# Patient Record
Sex: Female | Born: 1955 | Race: White | Hispanic: No | Marital: Married | Smoking: Never smoker
Health system: Southern US, Community
[De-identification: ages and names within clinical notes are randomized; demographics above are authoritative.]

## PROBLEM LIST (undated history)

## (undated) DIAGNOSIS — M1990 Unspecified osteoarthritis, unspecified site: Secondary | ICD-10-CM

## (undated) DIAGNOSIS — E039 Hypothyroidism, unspecified: Secondary | ICD-10-CM

## (undated) DIAGNOSIS — IMO0001 Reserved for inherently not codable concepts without codable children: Secondary | ICD-10-CM

## (undated) DIAGNOSIS — M359 Systemic involvement of connective tissue, unspecified: Secondary | ICD-10-CM

## (undated) DIAGNOSIS — R1310 Dysphagia, unspecified: Secondary | ICD-10-CM

## (undated) DIAGNOSIS — I10 Essential (primary) hypertension: Secondary | ICD-10-CM

## (undated) DIAGNOSIS — F419 Anxiety disorder, unspecified: Secondary | ICD-10-CM

## (undated) DIAGNOSIS — T7840XA Allergy, unspecified, initial encounter: Secondary | ICD-10-CM

## (undated) HISTORY — DX: Unspecified osteoarthritis, unspecified site: M19.90

## (undated) HISTORY — PX: DILATION AND CURETTAGE OF UTERUS: SHX78

## (undated) HISTORY — DX: Reserved for inherently not codable concepts without codable children: IMO0001

## (undated) HISTORY — DX: Hypothyroidism, unspecified: E03.9

## (undated) HISTORY — PX: CARPAL TUNNEL RELEASE: SHX101

## (undated) HISTORY — DX: Essential (primary) hypertension: I10

## (undated) HISTORY — PX: FOOT SURGERY: SHX648

## (undated) HISTORY — DX: Dysphagia, unspecified: R13.10

## (undated) HISTORY — DX: Systemic involvement of connective tissue, unspecified: M35.9

## (undated) HISTORY — DX: Allergy, unspecified, initial encounter: T78.40XA

## (undated) HISTORY — DX: Anxiety disorder, unspecified: F41.9

## (undated) HISTORY — PX: OTHER SURGICAL HISTORY: SHX169

---

## 1998-01-25 ENCOUNTER — Other Ambulatory Visit: Admission: RE | Admit: 1998-01-25 | Discharge: 1998-01-25 | Payer: Self-pay | Admitting: *Deleted

## 1999-03-07 ENCOUNTER — Other Ambulatory Visit: Admission: RE | Admit: 1999-03-07 | Discharge: 1999-03-07 | Payer: Self-pay | Admitting: *Deleted

## 2000-08-07 ENCOUNTER — Other Ambulatory Visit: Admission: RE | Admit: 2000-08-07 | Discharge: 2000-08-07 | Payer: Self-pay | Admitting: *Deleted

## 2003-08-03 ENCOUNTER — Other Ambulatory Visit: Admission: RE | Admit: 2003-08-03 | Discharge: 2003-08-03 | Payer: Self-pay | Admitting: *Deleted

## 2004-04-21 ENCOUNTER — Ambulatory Visit (HOSPITAL_COMMUNITY): Admission: RE | Admit: 2004-04-21 | Discharge: 2004-04-21 | Payer: Self-pay | Admitting: *Deleted

## 2004-08-08 ENCOUNTER — Other Ambulatory Visit: Admission: RE | Admit: 2004-08-08 | Discharge: 2004-08-08 | Payer: Self-pay | Admitting: *Deleted

## 2005-05-28 ENCOUNTER — Encounter (INDEPENDENT_AMBULATORY_CARE_PROVIDER_SITE_OTHER): Payer: Self-pay | Admitting: *Deleted

## 2005-05-28 LAB — CONVERTED CEMR LAB

## 2005-10-25 ENCOUNTER — Other Ambulatory Visit: Admission: RE | Admit: 2005-10-25 | Discharge: 2005-10-25 | Payer: Self-pay | Admitting: *Deleted

## 2006-10-31 ENCOUNTER — Ambulatory Visit (HOSPITAL_COMMUNITY): Admission: RE | Admit: 2006-10-31 | Discharge: 2006-10-31 | Payer: Self-pay | Admitting: *Deleted

## 2006-12-04 ENCOUNTER — Ambulatory Visit (HOSPITAL_COMMUNITY): Admission: RE | Admit: 2006-12-04 | Discharge: 2006-12-04 | Payer: Self-pay | Admitting: Family Medicine

## 2007-12-01 ENCOUNTER — Encounter (INDEPENDENT_AMBULATORY_CARE_PROVIDER_SITE_OTHER): Payer: Self-pay | Admitting: *Deleted

## 2007-12-01 ENCOUNTER — Ambulatory Visit: Payer: Self-pay | Admitting: Internal Medicine

## 2007-12-01 DIAGNOSIS — K219 Gastro-esophageal reflux disease without esophagitis: Secondary | ICD-10-CM | POA: Insufficient documentation

## 2007-12-01 DIAGNOSIS — E785 Hyperlipidemia, unspecified: Secondary | ICD-10-CM | POA: Insufficient documentation

## 2007-12-01 DIAGNOSIS — R7301 Impaired fasting glucose: Secondary | ICD-10-CM | POA: Insufficient documentation

## 2007-12-01 DIAGNOSIS — J309 Allergic rhinitis, unspecified: Secondary | ICD-10-CM | POA: Insufficient documentation

## 2007-12-01 DIAGNOSIS — E669 Obesity, unspecified: Secondary | ICD-10-CM | POA: Insufficient documentation

## 2007-12-01 DIAGNOSIS — M7660 Achilles tendinitis, unspecified leg: Secondary | ICD-10-CM | POA: Insufficient documentation

## 2007-12-01 DIAGNOSIS — M129 Arthropathy, unspecified: Secondary | ICD-10-CM | POA: Insufficient documentation

## 2007-12-01 DIAGNOSIS — R030 Elevated blood-pressure reading, without diagnosis of hypertension: Secondary | ICD-10-CM | POA: Insufficient documentation

## 2007-12-01 LAB — CONVERTED CEMR LAB: Blood Glucose, AC Bkfst: 100 mg/dL

## 2007-12-04 LAB — CONVERTED CEMR LAB
BUN: 16 mg/dL (ref 6–23)
CO2: 21 meq/L (ref 19–32)
Calcium: 8.8 mg/dL (ref 8.4–10.5)
Chloride: 107 meq/L (ref 96–112)
Cholesterol: 191 mg/dL (ref 0–200)
Creatinine, Ser: 0.77 mg/dL (ref 0.40–1.20)
Glucose, Bld: 103 mg/dL — ABNORMAL HIGH (ref 70–99)
HDL: 42 mg/dL (ref >39)
LDL Cholesterol: 124 mg/dL — ABNORMAL HIGH (ref 0–99)
Potassium: 4 meq/L (ref 3.5–5.3)
Sodium: 140 meq/L (ref 135–145)
Total CHOL/HDL Ratio: 4.5
Triglycerides: 126 mg/dL (ref <150)
VLDL: 25 mg/dL (ref 0–40)

## 2008-03-26 ENCOUNTER — Ambulatory Visit: Payer: Self-pay | Admitting: Internal Medicine

## 2008-03-26 LAB — CONVERTED CEMR LAB
Bilirubin Urine: NEGATIVE
Blood in Urine, dipstick: NEGATIVE
Glucose, Urine, Semiquant: NEGATIVE
Ketones, urine, test strip: NEGATIVE
Nitrite: NEGATIVE
Protein, U semiquant: NEGATIVE
Specific Gravity, Urine: <1.005
Urobilinogen, UA: 0.2
WBC Urine, dipstick: NEGATIVE
pH: 6.5

## 2008-06-24 ENCOUNTER — Encounter (INDEPENDENT_AMBULATORY_CARE_PROVIDER_SITE_OTHER): Payer: Self-pay | Admitting: Internal Medicine

## 2008-08-13 ENCOUNTER — Encounter (INDEPENDENT_AMBULATORY_CARE_PROVIDER_SITE_OTHER): Payer: Self-pay | Admitting: Internal Medicine

## 2008-08-18 ENCOUNTER — Ambulatory Visit: Payer: Self-pay | Admitting: Internal Medicine

## 2008-08-18 DIAGNOSIS — D485 Neoplasm of uncertain behavior of skin: Secondary | ICD-10-CM | POA: Insufficient documentation

## 2008-08-18 DIAGNOSIS — L570 Actinic keratosis: Secondary | ICD-10-CM | POA: Insufficient documentation

## 2008-08-18 LAB — CONVERTED CEMR LAB
Blood Glucose, Fingerstick: 98
Creatinine, Urine: 146.5 mg/dL
Hgb A1c MFr Bld: 5.6 %
Microalb Creat Ratio: 3.4 mg/g (ref 0.0–30.0)
Microalb, Ur: 0.5 mg/dL (ref 0.00–1.89)

## 2008-08-19 ENCOUNTER — Encounter (INDEPENDENT_AMBULATORY_CARE_PROVIDER_SITE_OTHER): Payer: Self-pay | Admitting: Internal Medicine

## 2008-08-19 LAB — CONVERTED CEMR LAB
ALT: 23 units/L (ref 0–35)
AST: 21 units/L (ref 0–37)
Albumin: 4.4 g/dL (ref 3.5–5.2)
Alkaline Phosphatase: 33 units/L — ABNORMAL LOW (ref 39–117)
BUN: 14 mg/dL (ref 6–23)
CO2: 23 meq/L (ref 19–32)
Calcium: 9.4 mg/dL (ref 8.4–10.5)
Chloride: 105 meq/L (ref 96–112)
Cholesterol: 217 mg/dL — ABNORMAL HIGH (ref 0–200)
Creatinine, Ser: 0.82 mg/dL (ref 0.40–1.20)
Glucose, Bld: 101 mg/dL — ABNORMAL HIGH (ref 70–99)
HDL: 46 mg/dL (ref >39)
LDL Cholesterol: 135 mg/dL — ABNORMAL HIGH (ref 0–99)
Potassium: 4 meq/L (ref 3.5–5.3)
Sodium: 142 meq/L (ref 135–145)
Total Bilirubin: 0.6 mg/dL (ref 0.3–1.2)
Total CHOL/HDL Ratio: 4.7
Total Protein: 8.1 g/dL (ref 6.0–8.3)
Triglycerides: 181 mg/dL — ABNORMAL HIGH (ref <150)
VLDL: 36 mg/dL (ref 0–40)

## 2008-09-01 ENCOUNTER — Encounter (INDEPENDENT_AMBULATORY_CARE_PROVIDER_SITE_OTHER): Payer: Self-pay | Admitting: Internal Medicine

## 2008-10-01 ENCOUNTER — Encounter (INDEPENDENT_AMBULATORY_CARE_PROVIDER_SITE_OTHER): Payer: Self-pay | Admitting: Internal Medicine

## 2009-01-10 ENCOUNTER — Encounter (INDEPENDENT_AMBULATORY_CARE_PROVIDER_SITE_OTHER): Payer: Self-pay | Admitting: Internal Medicine

## 2010-06-18 ENCOUNTER — Encounter: Payer: Self-pay | Admitting: Family Medicine

## 2012-08-27 ENCOUNTER — Ambulatory Visit (INDEPENDENT_AMBULATORY_CARE_PROVIDER_SITE_OTHER): Payer: 59 | Admitting: Physician Assistant

## 2012-08-27 VITALS — BP 120/76 | HR 75 | Temp 97.8°F | Resp 16 | Ht 62.0 in | Wt 209.0 lb

## 2012-08-27 DIAGNOSIS — J029 Acute pharyngitis, unspecified: Secondary | ICD-10-CM

## 2012-08-27 DIAGNOSIS — J309 Allergic rhinitis, unspecified: Secondary | ICD-10-CM

## 2012-08-27 DIAGNOSIS — K219 Gastro-esophageal reflux disease without esophagitis: Secondary | ICD-10-CM

## 2012-08-27 DIAGNOSIS — IMO0001 Reserved for inherently not codable concepts without codable children: Secondary | ICD-10-CM | POA: Insufficient documentation

## 2012-08-27 LAB — POCT RAPID STREP A (OFFICE): Rapid Strep A Screen: NEGATIVE

## 2012-08-27 MED ORDER — PANTOPRAZOLE SODIUM 40 MG PO TBEC: 40.0000 mg | DELAYED_RELEASE_TABLET | Freq: Every day | ORAL | Status: DC

## 2012-08-27 MED ORDER — BENZONATATE 100 MG PO CAPS: 100.0000 mg | ORAL_CAPSULE | Freq: Three times a day (TID) | ORAL | Status: DC | PRN

## 2012-08-27 MED ORDER — AMBULATORY NON FORMULARY MEDICATION: Status: DC

## 2012-08-27 MED ORDER — GUAIFENESIN ER 1200 MG PO TB12: 1.0000 | ORAL_TABLET | Freq: Two times a day (BID) | ORAL | Status: DC | PRN

## 2012-08-27 MED ORDER — FLUTICASONE PROPIONATE 50 MCG/ACT NA SUSP: 2.0000 | Freq: Every day | NASAL | Status: DC

## 2012-08-27 MED ORDER — HYDROCODONE-ACETAMINOPHEN 5-325 MG PO TABS: 1.0000 | ORAL_TABLET | Freq: Four times a day (QID) | ORAL | Status: DC | PRN

## 2012-08-27 NOTE — Patient Instructions (Signed)
Get plenty of rest and drink at least 64 ounces of water daily. 

## 2012-08-27 NOTE — Progress Notes (Signed)
Subjective:    Patient ID: Carolyn Stone, female    DOB: 1956-04-26, 57 y.o.   MRN: [id number]  HPI  This 57 y.o. female presents for evaluation of sore throat. Allergy symptoms x 2-3 weeks, became much worse yesterday.  Sore throat began last night, with increased eye symptoms (watery), swollen lymph nodes in the neck.  Little HA.  A little bit of runny nose.  Cough is non-productive. No GU symptoms.  Has increased reflux symptoms, OTC omeprazole has not been effective.  Would like to restart Protonix.   Past Medical History  Diagnosis Date  . Allergy   . Arthritis   . Anxiety   . Myalgia and myositis     "undetermined inflammatory process"    Past Surgical History  Procedure Laterality Date  . Cesarean section    . Dilation and curettage of uterus      x2  . Carpal tunnel release Bilateral   . Foot surgery Left     Prior to Admission medications   Medication Sig Start Date End Date Taking? Authorizing Provider  meloxicam (MOBIC) 7.5 MG tablet Take 7.5 mg by mouth daily.   Yes Historical Provider, MD    Allergies  Allergen Reactions  . Celebrex (Celecoxib)     Chest tightness  . Nitrofurantoin     REACTION: weakness, vomiting, fainting  . Penicillins     REACTION: Gi upset  . Vioxx (Rofecoxib)     Chest tightness    History   Social History  . Marital Status: Married    Spouse Name: Roger Shelter    Number of Children: 2  . Years of Education: 16   Occupational History  . teacher-part-time     GED classes   Social History Main Topics  . Smoking status: Never Smoker   . Smokeless tobacco: Never Used  . Alcohol Use: No  . Drug Use: No  . Sexually Active: Yes -- Female partner(s)    Birth Control/ Protection: Post-menopausal     Comment: husband s/p vasectomy   Other Topics Concern  . Not on file   Social History Narrative   Lives with her husband.  Their 2 daughters are grown and live independently.    Family History  Problem Relation Age of Onset   . Cancer Mother     squamous cell-larygopharyngeal  . Cancer Father     lung/liver cancer  . Mental illness Father     ?schizophrenia  . Hypertension Brother   . Mental illness Sister     Bipolar Disorder  . Cancer Maternal Grandmother   . Hypertension Paternal Grandmother   . Hyperlipidemia Paternal Grandmother   . Stroke Paternal Grandfather      Review of Systems As above.  Has chronic muscle pain, managed by PCP, ortho and rheumatology.    Objective:   Physical Exam Blood pressure 120/76, pulse 75, temperature 97.8 F (36.6 C), temperature source Oral, resp. rate 16, height 5\' 2"  (1.575 m), weight 209 lb (94.802 kg), SpO2 98.00%. Body mass index is 38.22 kg/(m^2). Well-developed, well nourished WF who is awake, alert and oriented, in NAD. HEENT: [place]/AT, PERRL, EOMI.  Sclera and conjunctiva are clear.  EAC are patent, TMs are normal in appearance. Nasal mucosa is pink and moist with crusted mucous. OP is clear. Neck: supple, non-tender, no lymphadenopathy, thyromegaly. Heart: RRR, no murmur Lungs: normal effort, CTA Extremities: no cyanosis, clubbing or edema. Skin: warm and dry without rash. Psychologic: good mood and appropriate affect, normal speech  and behavior.    Results for orders placed in visit on 08/27/12  POCT RAPID STREP A (OFFICE)      Result Value Range   Rapid Strep A Screen Negative  Negative       Assessment & Plan:  Pharyngitis - Likely due to allergic rhinitis and drainage. Plan: POCT rapid strep A, Guaifenesin (MUCINEX MAXIMUM STRENGTH) 1200 MG TB12, AMBULATORY NON FORMULARY MEDICATION (Hurricane mouthwash) 5-10 ml PO swish and spit or swallow Q2 hours prn, fluticasone (FLONASE) 50 MCG/ACT nasal spray, benzonatate (TESSALON) 100 MG capsule  GERD - Plan: pantoprazole (PROTONIX) 40 MG tablet

## 2012-10-24 ENCOUNTER — Ambulatory Visit (INDEPENDENT_AMBULATORY_CARE_PROVIDER_SITE_OTHER): Payer: 59 | Admitting: Family Medicine

## 2012-10-24 ENCOUNTER — Encounter: Payer: Self-pay | Admitting: Family Medicine

## 2012-10-24 VITALS — BP 132/94 | Temp 97.8°F | Ht 62.0 in | Wt 215.5 lb

## 2012-10-24 DIAGNOSIS — K219 Gastro-esophageal reflux disease without esophagitis: Secondary | ICD-10-CM

## 2012-10-24 MED ORDER — AZITHROMYCIN 250 MG PO TABS: ORAL_TABLET | ORAL | Status: DC

## 2012-10-24 MED ORDER — ALBUTEROL SULFATE HFA 108 (90 BASE) MCG/ACT IN AERS: 2.0000 | INHALATION_SPRAY | Freq: Four times a day (QID) | RESPIRATORY_TRACT | Status: DC | PRN

## 2012-10-24 MED ORDER — PANTOPRAZOLE SODIUM 40 MG PO TBEC: 40.0000 mg | DELAYED_RELEASE_TABLET | Freq: Every day | ORAL | Status: DC

## 2012-10-24 NOTE — Progress Notes (Signed)
  Subjective:    Patient ID: Carolyn Stone, female    DOB: Apr 21, 1956, 57 y.o.   MRN: [id number]  Cough This is a new problem. The current episode started 1 to 4 weeks ago. The problem has been gradually worsening. The problem occurs constantly. The cough is non-productive. Associated symptoms comments: reflux. Nothing aggravates the symptoms. She has tried prescription cough suppressant (mucinex) for the symptoms. The treatment provided no relief.      Review of Systems  Respiratory: Positive for cough.    Using the allegra and albuterol    Objective:   Physical Exam  Vitals reviewed. Constitutional: She appears well-developed.  HENT:  Head: Normocephalic.  Neck: Normal range of motion.  Cardiovascular: Normal rate, regular rhythm and normal heart sounds.   Pulmonary/Chest: Effort normal. No respiratory distress. She has no wheezes.  Lymphadenopathy:    She has no cervical adenopathy.          Assessment & Plan:  GERD - resume protonix , HOB 30 degrees, diet Uri- ATX Allergies- ventolin, flonase ,  Work on BP recheck again on follow up

## 2012-12-09 ENCOUNTER — Other Ambulatory Visit: Payer: Self-pay | Admitting: Podiatry

## 2012-12-09 DIAGNOSIS — M25571 Pain in right ankle and joints of right foot: Secondary | ICD-10-CM

## 2012-12-12 ENCOUNTER — Ambulatory Visit
Admission: RE | Admit: 2012-12-12 | Discharge: 2012-12-12 | Disposition: A | Discharge status: Home / Self Care | Payer: 59 | Source: Ambulatory Visit | Attending: Podiatry | Admitting: Podiatry

## 2012-12-12 DIAGNOSIS — M25571 Pain in right ankle and joints of right foot: Secondary | ICD-10-CM

## 2013-01-22 ENCOUNTER — Encounter: Payer: Self-pay | Admitting: Family Medicine

## 2013-01-22 ENCOUNTER — Ambulatory Visit (INDEPENDENT_AMBULATORY_CARE_PROVIDER_SITE_OTHER): Payer: 59 | Admitting: Family Medicine

## 2013-01-22 VITALS — BP 130/84 | Ht 62.0 in | Wt 213.8 lb

## 2013-01-22 DIAGNOSIS — R21 Rash and other nonspecific skin eruption: Secondary | ICD-10-CM

## 2013-01-22 MED ORDER — TRIAMCINOLONE ACETONIDE 0.1 % EX CREA: TOPICAL_CREAM | Freq: Two times a day (BID) | CUTANEOUS | Status: DC

## 2013-01-22 NOTE — Progress Notes (Signed)
  Subjective:    Patient ID: Carolyn Stone, female    DOB: 1955-06-10, 57 y.o.   MRN: [id number]  HPI  Patient arrives with a rash on feet for one week. Rash does itch-first noticed it when on prednisone last week.  pred was given for a chronic sprain plus cort shots.  Finished pred last tue, had fair amnt s e 's had multiple symptoms.  Did help joint pain  Review of Systems  no cough no chest pain no shortness of breath no wheezing no rash elsewhere    Objective:   Physical Exam  Alert hydration good. Lungs clear. Heart rare in remission. Feet and ankles multiple discrete papules. Some of excoriation.      Assessment & Plan:  Impression probable straw dust mites or equivalent with severe itching reaction. Plan triamcinolone twice a day topically. Benadryl when necessary symptomatic care discussed. 25 minutes spent most in discussion WSL

## 2013-01-22 NOTE — Patient Instructions (Addendum)
May add benadryl to the twice per day steroid cream

## 2013-03-26 ENCOUNTER — Ambulatory Visit: Payer: 59 | Admitting: Podiatry

## 2013-03-30 ENCOUNTER — Ambulatory Visit (INDEPENDENT_AMBULATORY_CARE_PROVIDER_SITE_OTHER): Payer: 59 | Admitting: Podiatry

## 2013-03-30 ENCOUNTER — Encounter: Payer: Self-pay | Admitting: Podiatry

## 2013-03-30 VITALS — BP 127/81 | HR 91 | Resp 18 | Ht 62.0 in | Wt 205.0 lb

## 2013-03-30 DIAGNOSIS — M7750 Other enthesopathy of unspecified foot: Secondary | ICD-10-CM

## 2013-03-30 DIAGNOSIS — M76829 Posterior tibial tendinitis, unspecified leg: Secondary | ICD-10-CM

## 2013-03-30 DIAGNOSIS — M7671 Peroneal tendinitis, right leg: Secondary | ICD-10-CM

## 2013-03-30 DIAGNOSIS — M76821 Posterior tibial tendinitis, right leg: Secondary | ICD-10-CM

## 2013-03-30 MED ORDER — MELOXICAM 15 MG PO TABS: 15.0000 mg | ORAL_TABLET | Freq: Every day | ORAL | Status: DC

## 2013-03-30 NOTE — Progress Notes (Signed)
Carolyn Stone presents today for followup of her right ankle. She states it is becoming more and more painful as time goes on it it does not move is easily. She states it is starting to her morbid here she points to the medial aspect of the right ankle but worse over the years she points to the lateral aspect of the right ankle is a radiates proximally up into the peroneal muscle space. She states it is limiting her daily activities and is not allowing her to get the exercise her primary Dr. is requesting of her. She's only able to stand on the foot for approximately one hour.  Objective: Vital signs are stable she is alert and oriented x3. She has severe tenderness on palpation of the peroneal tendons the posterior tibial tendon and the plantar fascial right. There is considerable edema without ecchymosis to the right foot and ankle only.  Assessment: Probable peroneal tear extending proximally toward the muscle. More than likely compensatory posterior tibial tendinitis and plantar fasciitis right.  Plan: We discussed etiology pathology conservative versus surgical therapies. At this point an MRI is indicated due to the chronicity of her pain all conservative therapies have failed. Conservative therapies include steroid all and nonsteroidal anti-inflammatories. Cortisone injections. Bracing and strapping and immobilization. Physical therapy and ice therapy. I will followup with her once her MRI comes in. She will continue her nonsteroidals until this occurs.

## 2013-03-30 NOTE — Progress Notes (Signed)
  Subjective:    Patient ID: Carolyn Stone, female    DOB: [birth year], 57 y.o.   MRN: [id number]  HPI    Review of Systems  Constitutional: Negative.   HENT: Positive for congestion.   Eyes: Negative.   Respiratory: Negative.   Cardiovascular: Negative.   Gastrointestinal:       Upset stomach  Endocrine: Negative.   Allergic/Immunologic: Positive for environmental allergies.  Neurological: Negative.   Hematological: Negative.   Psychiatric/Behavioral: Negative.        Objective:   Physical Exam        Assessment & Plan:

## 2013-03-31 ENCOUNTER — Other Ambulatory Visit: Payer: Self-pay | Admitting: Podiatry

## 2013-03-31 DIAGNOSIS — M25571 Pain in right ankle and joints of right foot: Secondary | ICD-10-CM

## 2013-04-06 ENCOUNTER — Other Ambulatory Visit: Payer: 59

## 2013-04-07 ENCOUNTER — Ambulatory Visit
Admission: RE | Admit: 2013-04-07 | Discharge: 2013-04-07 | Disposition: A | Discharge status: Home / Self Care | Payer: 59 | Source: Ambulatory Visit | Attending: Podiatry | Admitting: Podiatry

## 2013-04-07 DIAGNOSIS — M25571 Pain in right ankle and joints of right foot: Secondary | ICD-10-CM

## 2013-04-08 ENCOUNTER — Telehealth: Payer: Self-pay | Admitting: *Deleted

## 2013-04-08 NOTE — Telephone Encounter (Signed)
Message copied by Marissa Nestle on Wed Apr 08, 2013 12:40 PM ------      Message from: Ernestene Kiel T      Created: Tue Apr 07, 2013  5:15 PM       Inform patient that she has a split tear and that she should come in for a surgical consult. ------

## 2013-04-08 NOTE — Telephone Encounter (Addendum)
Left message to call for an appt to discuss MRI  Results.  Called pt to encourage her to make an appt to see Dr Al Corpus to discuss the treatment of her tendon tear.  Transferred pt to scheduler.

## 2013-04-09 NOTE — Telephone Encounter (Deleted)
Message copied by Marissa Nestle on Thu Apr 09, 2013  4:49 PM ------      Message from: Carolyn Stone      Created: Tue Apr 07, 2013  5:15 PM       Inform patient that she has a split tear and that she should come in for a surgical consult. ------

## 2013-04-29 ENCOUNTER — Ambulatory Visit (INDEPENDENT_AMBULATORY_CARE_PROVIDER_SITE_OTHER): Payer: 59 | Admitting: Podiatry

## 2013-04-29 ENCOUNTER — Encounter: Payer: Self-pay | Admitting: Podiatry

## 2013-04-29 VITALS — BP 150/86 | HR 84 | Resp 16

## 2013-04-29 DIAGNOSIS — S86301D Unspecified injury of muscle(s) and tendon(s) of peroneal muscle group at lower leg level, right leg, subsequent encounter: Secondary | ICD-10-CM

## 2013-04-29 DIAGNOSIS — Z5189 Encounter for other specified aftercare: Secondary | ICD-10-CM

## 2013-04-29 NOTE — Progress Notes (Signed)
Carolyn Stone presents today for followup of her MRI results and a surgical consult.  Objective: Vital signs are stable she is alert and oriented x3. She still has strong palpable pulses to the right lower extremity. Pain on palpation to this supra-lateral malleolar region extending distally to the fifth metatarsal base. The MRI confirmed a split tear of the prone is brevis posterior superior aspect of the lateral ankle extending past the subtalar joint. This area still tender on palpation today with moderate edema.  Assessment: Proteus brevis split tear.  Plan: We considered her for surgery today. We went over consent form today line bylined number by number giving her ample time to ask questions she saw fit regarding an open repair of the peroneal is brevis tendon. I answered all the questions regarding this procedure to the best of my ability in layman's terms she understood it was amenable to it and signed Dr. pages the consent form. She understands that she will be nonweightbearing for a period of no less than 6 weeks. She will have a fiberglass cast on when she leaves the operating room. I will followup with her in the near future.

## 2013-05-05 ENCOUNTER — Ambulatory Visit (INDEPENDENT_AMBULATORY_CARE_PROVIDER_SITE_OTHER): Payer: 59 | Admitting: Family Medicine

## 2013-05-05 ENCOUNTER — Encounter: Payer: Self-pay | Admitting: Family Medicine

## 2013-05-05 ENCOUNTER — Telehealth: Payer: Self-pay

## 2013-05-05 VITALS — BP 122/80 | Ht 62.0 in | Wt 215.0 lb

## 2013-05-05 DIAGNOSIS — J329 Chronic sinusitis, unspecified: Secondary | ICD-10-CM

## 2013-05-05 DIAGNOSIS — Z79899 Other long term (current) drug therapy: Secondary | ICD-10-CM

## 2013-05-05 DIAGNOSIS — R7301 Impaired fasting glucose: Secondary | ICD-10-CM

## 2013-05-05 DIAGNOSIS — E785 Hyperlipidemia, unspecified: Secondary | ICD-10-CM

## 2013-05-05 MED ORDER — AZITHROMYCIN 250 MG PO TABS: ORAL_TABLET | ORAL | Status: AC

## 2013-05-05 MED ORDER — BENZONATATE 100 MG PO CAPS
100.0000 mg | ORAL_CAPSULE | Freq: Three times a day (TID) | ORAL | Status: DC | PRN
Start: 2013-05-05 — End: 2013-05-27

## 2013-05-05 NOTE — Telephone Encounter (Signed)
Notified patient.

## 2013-05-05 NOTE — Telephone Encounter (Signed)
Patient wants some tessalon perles prescribed for her cough. [facility] [employer]

## 2013-05-05 NOTE — Telephone Encounter (Signed)
Numb 30 one every 6 hrs prn cough

## 2013-05-05 NOTE — Progress Notes (Signed)
   Subjective:    Patient ID: Carolyn Stone, female    DOB: 1955-06-03, 57 y.o.   MRN: [id number]  HPI Patient is here today for routine check up and to have screening blood work ordered. She does suffer with elevated blood pressure at times. In the past her blood pressure has generally been good.  Pt has split tendon in ankle, needs repair. Feels she needs b w before this May need sig surgery.  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   Patient has been experiencing fever, muscle aches, headaches and congestion. Started on Sunday night. Tickle in throat started. Now prod cough. Sig headache, frontal, 101.2 temp and elevated. Took aleave etc.   Patient has history of hyperlipidemia . Patient claims trying to watch diet though not the best. Unfortunately not exercising. Continues to gain weight.  Patient is stiff" though some tolerance. In fact once told many years ago that she may have diabetes. A foot doctor 1 in her to have blood tests rule out diabetes before considering any foot surgery.  Review of Systems No chest pain or back pain no shortness breath no abdominal pain ROS otherwise negative    Objective: c   Physical Exam  Alert no apparent distress. HEENT ENT significant nasal congestion. Some frontal tenderness. Neck tender anterior nodes. l. Lungs clear. Heart regular in rhythm.    blood pressure on repeat 1:30 to over 80 Plan:  Impression 1 elevated blood pressure stable this time to impaired fasting glucose with questionable history diabetes in need of blood testing. #3 hyperlipidemia status uncertain #4 acute rhinosinusitis. Plan antibiotics prescribed. Symptomatic care discussed. Diet and exercise discussed. Appropriate blood work. WSL

## 2013-05-08 LAB — BASIC METABOLIC PANEL
BUN: 13 mg/dL (ref 6–23)
CO2: 28 mEq/L (ref 19–32)
Calcium: 8.7 mg/dL (ref 8.4–10.5)
Chloride: 104 mEq/L (ref 96–112)
Creat: 0.71 mg/dL (ref 0.50–1.10)
Glucose, Bld: 114 mg/dL — ABNORMAL HIGH (ref 70–99)
Potassium: 3.7 mEq/L (ref 3.5–5.3)

## 2013-05-08 LAB — HEPATIC FUNCTION PANEL
Albumin: 3.8 g/dL (ref 3.5–5.2)
Alkaline Phosphatase: 29 U/L — ABNORMAL LOW (ref 39–117)
Bilirubin, Direct: 0.1 mg/dL (ref 0.0–0.3)
Indirect Bilirubin: 0.4 mg/dL (ref 0.0–0.9)
Total Bilirubin: 0.5 mg/dL (ref 0.3–1.2)

## 2013-05-08 LAB — HEMOGLOBIN A1C
Hgb A1c MFr Bld: 5.8 % — ABNORMAL HIGH (ref <5.7)
Mean Plasma Glucose: 120 mg/dL — ABNORMAL HIGH (ref <117)

## 2013-05-08 LAB — LIPID PANEL
Cholesterol: 144 mg/dL (ref 0–200)
HDL: 33 mg/dL — ABNORMAL LOW (ref >39)
LDL Cholesterol: 86 mg/dL (ref 0–99)
Total CHOL/HDL Ratio: 4.4 Ratio
Triglycerides: 125 mg/dL (ref <150)
VLDL: 25 mg/dL (ref 0–40)

## 2013-05-22 DIAGNOSIS — S96999A Other specified injury of unspecified muscle and tendon at ankle and foot level, unspecified foot, initial encounter: Secondary | ICD-10-CM

## 2013-05-22 DIAGNOSIS — S91309A Unspecified open wound, unspecified foot, initial encounter: Secondary | ICD-10-CM

## 2013-05-26 ENCOUNTER — Encounter: Payer: Self-pay | Admitting: Podiatry

## 2013-05-27 ENCOUNTER — Encounter: Payer: Self-pay | Admitting: Podiatry

## 2013-05-27 ENCOUNTER — Ambulatory Visit (INDEPENDENT_AMBULATORY_CARE_PROVIDER_SITE_OTHER): Payer: 59 | Admitting: Podiatry

## 2013-05-27 VITALS — BP 127/74 | HR 70 | Resp 16 | Ht 62.0 in | Wt 212.0 lb

## 2013-05-27 DIAGNOSIS — Z9889 Other specified postprocedural states: Secondary | ICD-10-CM

## 2013-05-27 NOTE — Progress Notes (Signed)
Carolyn Stone presents today one week status post peroneus brevis tendon repair with cast right lower extremity. She denies fever chills nausea vomiting muscle aches or pains. She states she cannot utilize the crutches due to her hands and shoulders and her left foot. She states that she has not been taking her aspirin as prescribed, she forgot it.  Objective: Vital signs are stable she is alert and oriented x3. She is able to move her toes with full sensation to toes one through 5 of the right foot. The cast is starting to loosen around the calf.  Assessment: Well-healing surgical right lower extremity.  Plan: Followup with her in one week at which time the cast will be removed wound is evaluated. A new cast will be applied at that time.

## 2013-05-29 ENCOUNTER — Telehealth: Payer: Self-pay | Admitting: *Deleted

## 2013-05-29 MED ORDER — FLUCONAZOLE 150 MG PO TABS: 150.0000 mg | ORAL_TABLET | Freq: Every day | ORAL | Status: DC

## 2013-05-29 NOTE — Telephone Encounter (Signed)
PT CALLED REQUESTING DIFLUCAN DUE TO A YEAST INFECTION FROM HER ANTIBIOTIC THAT WAS GIVEN TO HER ON HER SURGERY DATE 12.26.14.

## 2013-06-04 ENCOUNTER — Ambulatory Visit (INDEPENDENT_AMBULATORY_CARE_PROVIDER_SITE_OTHER): Payer: 59 | Admitting: Podiatry

## 2013-06-04 ENCOUNTER — Encounter: Payer: Self-pay | Admitting: Podiatry

## 2013-06-04 VITALS — BP 141/81 | HR 71 | Temp 97.7°F | Resp 16

## 2013-06-04 DIAGNOSIS — Z9889 Other specified postprocedural states: Secondary | ICD-10-CM

## 2013-06-04 NOTE — Progress Notes (Signed)
   Subjective:    Patient ID: Carolyn Stone, female    DOB: 02-05-1956, 58 y.o.   MRN: [id number]  HPI Comments: Routine post op right foot  Peroneus brevis tendon repair , its ok , little pain when stretching      Review of Systems     Objective:   Physical Exam: Vital signs are stable she is alert and oriented x3. Cast is intact to the right lower extremity today. We removed the cast and total today remove the dressing she has mild erythema no edema cellulitis drainage or odor to the lateral incision site around the ankle. It appears to be healing normally. Staples are intact.        Assessment & Plan:  Assessment: Well-healing surgical foot right.  Plan: Redressed today dressed a compressive dressing we decided to leave the cast off and does put her in a Cam Walker she will continue to be nonweightbearing with this. I will followup with her in 2 weeks for suture removal

## 2013-06-18 ENCOUNTER — Encounter: Payer: Self-pay | Admitting: Podiatry

## 2013-06-18 ENCOUNTER — Ambulatory Visit (INDEPENDENT_AMBULATORY_CARE_PROVIDER_SITE_OTHER): Payer: 59 | Admitting: Podiatry

## 2013-06-18 VITALS — BP 121/75 | HR 85 | Resp 16

## 2013-06-18 DIAGNOSIS — Z9889 Other specified postprocedural states: Secondary | ICD-10-CM

## 2013-06-18 NOTE — Progress Notes (Signed)
Routine post op right foot peroneus brevis tendon repair 12.26.14 , pt states it is doing well . She denies fever chills nausea vomiting muscle aches or pains.  Objective: Dry sterile dressing was removed demonstrates mild edema overlying the lateral right ankle. Staples are intact there were removed today margins remain well coapted with the exception of one small area at the level of the malleolus. She has good range of motion of the foot dorsiflexion plantar flexion inversion eversion.  Assessment well-healing surgical ankle lateral right.  Plan: I Steri-Stripped the incision today and redressed her for another week to week and a half. I will followup with her at that point in time and consider physical therapy.

## 2013-06-29 ENCOUNTER — Ambulatory Visit (INDEPENDENT_AMBULATORY_CARE_PROVIDER_SITE_OTHER): Payer: 59 | Admitting: Podiatry

## 2013-06-29 ENCOUNTER — Encounter: Payer: Self-pay | Admitting: Podiatry

## 2013-06-29 VITALS — BP 137/80 | HR 79 | Resp 16

## 2013-06-29 DIAGNOSIS — Z9889 Other specified postprocedural states: Secondary | ICD-10-CM

## 2013-06-29 NOTE — Progress Notes (Signed)
Post op 12.26.14 right foot peroneus brevis tendon repair, pt states it stings sometimes . She denies fever chills nausea vomiting muscle aches or pains.  Objective: Vital signs are stable she is alert and oriented x3. Margins are intact to the lateral incision. She is a good range of motion of the right foot.  Assessment: Well-healing peroneal tendon reconstruction right.  Plan: We will send her for physical therapy and I will followup with her once she has completed that. I did encourage her to ambulate without the crutches.

## 2013-07-16 ENCOUNTER — Inpatient Hospital Stay (HOSPITAL_COMMUNITY): Admission: RE | Admit: 2013-07-16 | Payer: 59 | Source: Ambulatory Visit | Admitting: Physical Therapy

## 2013-07-21 ENCOUNTER — Telehealth: Payer: Self-pay | Admitting: *Deleted

## 2013-07-21 NOTE — Telephone Encounter (Signed)
Carolyn Stone states only the initial visit for Physical Therapy performed.  Pt states will do the required PT at home on her own due to large co-pay for each visit.

## 2013-07-22 ENCOUNTER — Ambulatory Visit (HOSPITAL_COMMUNITY)
Admission: RE | Admit: 2013-07-22 | Discharge: 2013-07-22 | Disposition: A | Discharge status: Home / Self Care | Payer: 59 | Source: Ambulatory Visit | Attending: Podiatry | Admitting: Podiatry

## 2013-07-22 DIAGNOSIS — R262 Difficulty in walking, not elsewhere classified: Secondary | ICD-10-CM | POA: Insufficient documentation

## 2013-07-22 DIAGNOSIS — E785 Hyperlipidemia, unspecified: Secondary | ICD-10-CM | POA: Insufficient documentation

## 2013-07-22 DIAGNOSIS — M25579 Pain in unspecified ankle and joints of unspecified foot: Secondary | ICD-10-CM

## 2013-07-22 DIAGNOSIS — M25676 Stiffness of unspecified foot, not elsewhere classified: Secondary | ICD-10-CM | POA: Insufficient documentation

## 2013-07-22 DIAGNOSIS — IMO0001 Reserved for inherently not codable concepts without codable children: Secondary | ICD-10-CM | POA: Insufficient documentation

## 2013-07-22 DIAGNOSIS — E669 Obesity, unspecified: Secondary | ICD-10-CM | POA: Insufficient documentation

## 2013-07-22 DIAGNOSIS — Z9889 Other specified postprocedural states: Secondary | ICD-10-CM | POA: Insufficient documentation

## 2013-07-22 DIAGNOSIS — K219 Gastro-esophageal reflux disease without esophagitis: Secondary | ICD-10-CM | POA: Insufficient documentation

## 2013-07-22 DIAGNOSIS — M25673 Stiffness of unspecified ankle, not elsewhere classified: Secondary | ICD-10-CM | POA: Insufficient documentation

## 2013-07-22 DIAGNOSIS — E119 Type 2 diabetes mellitus without complications: Secondary | ICD-10-CM | POA: Insufficient documentation

## 2013-07-22 NOTE — Evaluation (Signed)
Physical Therapy Evaluation  Patient Details  Name: Carolyn Stone MRN: [id number] Date of Birth: 05-07-1956  Today's Date: 07/22/2013 Time: 1310-1345 PT Time Calculation (min): 35 min Charge:  Evaluation 1210-1340             Visit#: 1 of 12  Re-eval: 08/21/13 Assessment Diagnosis: s/p Rt peroneal tendon repair Surgical Date: 05/22/14 Next MD Visit: 07/27/2013 Prior Therapy: one [place] visit  Authorization: [facility]     Past Medical History:  Past Medical History  Diagnosis Date  . Allergy   . Arthritis   . Anxiety   . Myalgia and myositis     "undetermined inflammatory process"   Past Surgical History:  Past Surgical History  Procedure Laterality Date  . Cesarean section    . Dilation and curettage of uterus      x2  . Carpal tunnel release Bilateral   . Foot surgery Left     Subjective Symptoms/Limitations Symptoms: Carolyn Stone states that she was having pain since the summer of 2014.  She had surgery for a ruptured peroneus brevis  in late December.  It was repaired and she was casted.  The cast was removed four weeks later and was placed in a walking boot .  She is now being referred for therapy to increase her motion and decrease her swelling How long can you sit comfortably?: sitting  How long can you stand comfortably?: able to stand for 20 minutes due to back pain How long can you walk comfortably?: Able to walk for 10-15 minutes. Pain Assessment Currently in Pain?: Yes Pain Score: 2  (at rest increases to a 5/10) Pain Location: Ankle Pain Orientation: Right Pain Type: Surgical pain    Prior Functioning  Prior Function Vocation: Part time employment Vocation Requirements: sitting Leisure: Hobbies-yes (Comment) Comments: running, walking , photography, horseback riding    Sensation/Coordination/Flexibility/Functional Tests Functional Tests Functional Tests: FS 35  Assessment RLE AROM (degrees) Right Ankle Dorsiflexion: 3 Right Ankle Plantar  Flexion: 45 Right Ankle Inversion: 15 Right Ankle Eversion: 10 RLE Strength Right Ankle Dorsiflexion: 3+/5 Right Ankle Plantar Flexion: 3+/5 Right Ankle Inversion: 3+/5 Right Ankle Eversion: 3-/5  Exercise/Treatments      Ankle Exercises - Supine Isometrics:  (all x 10) Other Supine Ankle Exercises: ROM x 10    Physical Therapy Assessment and Plan PT Assessment and Plan Clinical Impression Statement: Pt is a 58 yo female who had no injury but began having significant ankle pain.  Surgery was performed on 05/22/2014 for a ruptured peronus brevis tendon. She is now out of her cast and ready to participate in rehab to strengthen her ankle so she can ambulate without her cast  boot.  Pt will benefit from skilled therapy to increase ROM, Strength and proprioception to maximize her functional ability.   Pt will benefit from skilled therapeutic intervention in order to improve on the following deficits: Pain;Decreased activity tolerance;Increased edema;Decreased range of motion;Decreased balance;Decreased strength;Difficulty walking Rehab Potential: Good PT Frequency: Min 3X/week PT Duration: 4 weeks PT Treatment/Interventions: Gait training;Therapeutic activities;Therapeutic exercise;Manual techniques;Modalities PT Plan: continue with strengtening begin sitting Baps, ankle alphabet; sidelying In/eversion; standing heelraise/toeraise exercies.    Goals Home Exercise Program Pt/caregiver will Perform Home Exercise Program: For increased ROM PT Short Term Goals Time to Complete Short Term Goals: 2 weeks PT Short Term Goal 1: Pt ROM to be wfl to allow a normal gait pattern once pt has adequate strength PT Short Term Goal 2: strength to be improved 1/2 grade PT  Short Term Goal 3: Pt to be able to walk 1/2 mile without pain PT Short Term Goal 4: Pain level to be no greater than a 2 80% of the day. PT Short Term Goal 5: PT to be usng the boot only 50% of the day. PT Long Term Goals Time to  Complete Long Term Goals: 4 weeks PT Long Term Goal 1: Pt Strength to be improved one grade to allow normal ambulation without boot. PT Long Term Goal 2: Pt to bed able to stand for an hour to make an indepth meal  Long Term Goal 3: Pt to be able to walk for a mile without any increase pain Long Term Goal 4: Pain level to be at a 80% of the time PT Long Term Goal 5: Pt to be able to ride horses  Problem List Patient Active Problem List   Diagnosis Date Noted  . Stiffness of joint, not elsewhere classified, ankle and foot 07/22/2013  . Pain in joint, ankle and foot 07/22/2013  . Difficulty in walking(719.7) 07/22/2013  . Myalgia and myositis   . NEOPLASM, SKIN, UNCERTAIN BEHAVIOR 83/41/9622  . ACTINIC KERATOSIS 08/18/2008  . DIABETES MELLITUS, TYPE II 12/01/2007  . HYPERLIPIDEMIA 12/01/2007  . OBESITY 12/01/2007  . ALLERGIC RHINITIS 12/01/2007  . GERD 12/01/2007  . ARTHRITIS 12/01/2007  . ACHILLES TENDINITIS, BILATERAL 12/01/2007  . INCREASED BLOOD PRESSURE 12/01/2007    PT Plan of Care PT Home Exercise Plan: given.  GP    Carolyn Stone 07/22/2013, 2:06 PM  Physician Documentation Your signature is required to indicate approval of the treatment plan as stated above.  Please sign and either send electronically or make a copy of this report for your files and return this physician signed original.   Please mark one 1.__approve of plan  2. ___approve of plan with the following conditions.   ______________________________                                                          _____________________ Physician Signature                                                                                                             Date

## 2013-07-27 ENCOUNTER — Ambulatory Visit (INDEPENDENT_AMBULATORY_CARE_PROVIDER_SITE_OTHER): Payer: 59 | Admitting: Podiatry

## 2013-07-27 VITALS — BP 122/75 | HR 93 | Resp 16 | Ht 62.0 in | Wt 212.0 lb

## 2013-07-27 DIAGNOSIS — Z9889 Other specified postprocedural states: Secondary | ICD-10-CM

## 2013-07-27 DIAGNOSIS — M7671 Peroneal tendinitis, right leg: Secondary | ICD-10-CM

## 2013-07-27 DIAGNOSIS — M7750 Other enthesopathy of unspecified foot: Secondary | ICD-10-CM

## 2013-07-27 NOTE — Progress Notes (Signed)
She presents today for a peroneal tendon repair date of surgery 05/22/2013. She states it is doing so much better she is very happy with the outcome. Currently she is in physical therapy. She has no fever chills nausea vomiting muscle aches or pains.  Objective: Vital signs are stable she is alert and oriented x3. She has minimal tenderness on palpation of the incision site of the right foot. She has full range of motion with 4/5 muscle strength on eversion against resistance.  Assessment: Well-healing surgical foot peroneal tendon repair 05/22/2013.  Plan: Continue physical therapy. I did place her in a Tri-Lock brace. I encouraged her to start with shoe gear while utilizing the brace. I will followup with her in one month

## 2013-07-29 ENCOUNTER — Ambulatory Visit (HOSPITAL_COMMUNITY)
Admission: RE | Admit: 2013-07-29 | Discharge: 2013-07-29 | Disposition: A | Discharge status: Home / Self Care | Payer: 59 | Source: Ambulatory Visit | Attending: Family Medicine | Admitting: Family Medicine

## 2013-07-29 DIAGNOSIS — E119 Type 2 diabetes mellitus without complications: Secondary | ICD-10-CM | POA: Insufficient documentation

## 2013-07-29 DIAGNOSIS — Z9889 Other specified postprocedural states: Secondary | ICD-10-CM | POA: Insufficient documentation

## 2013-07-29 DIAGNOSIS — M25676 Stiffness of unspecified foot, not elsewhere classified: Secondary | ICD-10-CM | POA: Insufficient documentation

## 2013-07-29 DIAGNOSIS — IMO0001 Reserved for inherently not codable concepts without codable children: Secondary | ICD-10-CM | POA: Insufficient documentation

## 2013-07-29 DIAGNOSIS — K219 Gastro-esophageal reflux disease without esophagitis: Secondary | ICD-10-CM | POA: Insufficient documentation

## 2013-07-29 DIAGNOSIS — E669 Obesity, unspecified: Secondary | ICD-10-CM | POA: Insufficient documentation

## 2013-07-29 DIAGNOSIS — M25673 Stiffness of unspecified ankle, not elsewhere classified: Secondary | ICD-10-CM | POA: Insufficient documentation

## 2013-07-29 DIAGNOSIS — E785 Hyperlipidemia, unspecified: Secondary | ICD-10-CM | POA: Insufficient documentation

## 2013-07-29 NOTE — Progress Notes (Signed)
Physical Therapy Treatment Patient Details  Name: Carolyn Stone MRN: [id number] Date of Birth: [birth year]  Today's Date: 07/29/2013 Time: 9211-9417 PT Time Calculation (min): 43 min Visit#: 2 of 12  Re-eval: 08/21/13 Authorization: [employer]  Charges:  therex 4081-4481 (26'), manual [phone] (10')  Subjective: Symptoms/Limitations Symptoms: Pt states she went to MD on Monday and he discharged her CAM boot and is now wearing a ALSO.  States she hurt bad last night up to 7/8 but now not hurting.  States she may have overdone it over the weekend. Pain Assessment Currently in Pain?: No/denies   Exercise/Treatments Ankle Exercises - Standing SLS: max of 5" Rt LE only no UE's Heel Raises: 15 reps Toe Raise: 15 reps Ankle Exercises - Seated BAPS: Level 2;Sitting;10 reps;Limitations BAPS Limitations: 10 A/P, R/L, 5X CW/CCW Other Seated Ankle Exercises: alphabet 1X     Manual Therapy Manual Therapy: Myofascial release Myofascial Release: to scar and surrounding tissue to decrease adhesions and increase ROM  Physical Therapy Assessment and Plan PT Assessment and Plan Clinical Impression Statement: Progressed activity today; most difficulty going into inversion and producing movement laterally.  Pt with moderate tenderness over scar region with tightness/adhesions.  Able to tolerate myofascial techniques with increased plyobility of tissues afterward.  Pt instructed to ice and modify exercises/activity to amount of soreness as she is no longer using her CAM boot.     PT Plan: Add sidelying In/eversion and progress standing exercises.     Problem List Patient Active Problem List   Diagnosis Date Noted  . Stiffness of joint, not elsewhere classified, ankle and foot 07/22/2013  . Pain in joint, ankle and foot 07/22/2013  . Difficulty in walking(719.7) 07/22/2013  . Myalgia and myositis   . NEOPLASM, SKIN, UNCERTAIN BEHAVIOR 02/63/7858  . ACTINIC KERATOSIS 08/18/2008  . DIABETES  MELLITUS, TYPE II 12/01/2007  . HYPERLIPIDEMIA 12/01/2007  . OBESITY 12/01/2007  . ALLERGIC RHINITIS 12/01/2007  . GERD 12/01/2007  . ARTHRITIS 12/01/2007  . ACHILLES TENDINITIS, BILATERAL 12/01/2007  . INCREASED BLOOD PRESSURE 12/01/2007      Teena Irani, PTA/CLT 07/29/2013, 3:10 PM

## 2013-07-31 ENCOUNTER — Ambulatory Visit (HOSPITAL_COMMUNITY)
Admission: RE | Admit: 2013-07-31 | Discharge: 2013-07-31 | Disposition: A | Discharge status: Home / Self Care | Payer: 59 | Source: Ambulatory Visit | Attending: Family Medicine | Admitting: Family Medicine

## 2013-07-31 NOTE — Progress Notes (Signed)
Physical Therapy Treatment Patient Details  Name: Carolyn Stone MRN: [id number] Date of Birth: 12-06-1955  Today's Date: 07/31/2013 Time: 6144-3154 PT Time Calculation (min): 41 min Charges: Therapeutic Exercise 1304 - 1336 (32); Manual Therapy 1336-1345 (9)   Visit#: 3 of 12  Authorization: [employer]   Subjective: Symptoms/Limitations Symptoms: Patient notes continued pain and that she has been doing her exercises irregularly, typically at night when able.  Pain Assessment Currently in Pain?: No/denies Pain Score: 4  Pain Location: Ankle Pain Orientation: Right Pain Type: Surgical pain   Exercise/Treatments Ankle Stretches Plantar Fascia Stretch: 2 reps;30 seconds (SL standing on 4inch box with other LE for support) Ankle Exercises - Standing BAPS: Standing;Level 1;Level 2;10 reps;Other (comment) (sagital/frontal plane, single leg, level2 painful in frontal) SLS: R LE, 2" max without UE support. Rocker Board: 2 minutes Heel Raises: 5 reps (pain 7/10 after 7 reps, patient notes fatigue) Toe Raise: 15 reps (Verbal and tactile cues to decrease post weight shift) Other Standing Ankle Exercises: Gait training with emphaisis on even stride length and heel to pattern Other Standing Ankle Exercises: 4" step up in parallel bars 10x Ankle Exercises - Sidelying Ankle Inversion: AROM;Strengthening;Right;10 reps Ankle Eversion: AROM;Strengthening;Right;10 reps Manual Therapy Manual Therapy: Myofascial release Other Manual Therapy: manual stretching of ankle plantar and dorsiflexors. Scar tissue mobilization.   Physical Therapy Assessment and Plan PT Assessment and Plan Pt will benefit from skilled therapeutic intervention in order to improve on the following deficits: Pain;Decreased activity tolerance;Increased edema;Decreased range of motion;Decreased balance;Decreased strength;Difficulty walking Rehab Potential: Good PT Frequency: Min 3X/week PT Duration: 4 weeks PT  Treatment/Interventions: Gait training;Therapeutic activities;Therapeutic exercise;Manual techniques;Modalities PT Plan: Continue to increase patient's tolerance with weight bearing activities.     Problem List Patient Active Problem List   Diagnosis Date Noted  . Stiffness of joint, not elsewhere classified, ankle and foot 07/22/2013  . Pain in joint, ankle and foot 07/22/2013  . Difficulty in walking(719.7) 07/22/2013  . Myalgia and myositis   . NEOPLASM, SKIN, UNCERTAIN BEHAVIOR 00/86/7619  . ACTINIC KERATOSIS 08/18/2008  . DIABETES MELLITUS, TYPE II 12/01/2007  . HYPERLIPIDEMIA 12/01/2007  . OBESITY 12/01/2007  . ALLERGIC RHINITIS 12/01/2007  . GERD 12/01/2007  . ARTHRITIS 12/01/2007  . ACHILLES TENDINITIS, BILATERAL 12/01/2007  . INCREASED BLOOD PRESSURE 12/01/2007    PT Plan of Care PT Home Exercise Plan: given. PT Patient Instructions: Patient instructed in proper gait mechanics.   GP   Devona Konig DPT  Carolyn Stone,CINDY 07/31/2013, 1:51 PM

## 2013-08-04 ENCOUNTER — Ambulatory Visit (HOSPITAL_COMMUNITY)
Admission: RE | Admit: 2013-08-04 | Discharge: 2013-08-04 | Disposition: A | Discharge status: Home / Self Care | Payer: 59 | Source: Ambulatory Visit | Attending: Family Medicine | Admitting: Family Medicine

## 2013-08-04 NOTE — Progress Notes (Signed)
Physical Therapy Treatment Patient Details  Name: Carolyn Stone MRN: [id number] Date of Birth: 01-07-1956  Today's Date: 08/04/2013 Time: 6314-9702 PT Time Calculation (min): 50 min Charges: Therex x [phone]) Ice x 77'(4128-7867)  Visit#: 4 of 12   Authorization: [employer]   Subjective: Symptoms/Limitations Symptoms: Pt states that she did a lot of walking over the weeked and she is a little sore. Pain Assessment Currently in Pain?: Yes Pain Score: 2  Pain Location: Ankle Pain Orientation: Right   Exercise/Treatments Ankle Stretches Plantar Fascia Stretch: 2 reps;30 seconds Slant Board Stretch: 2 reps;30 seconds Ankle Exercises - Standing BAPS: Standing;Level 2;10 reps SLS: RLE: 2x30" HHA PRN Rocker Board: 2 minutes Heel Raises: 10 reps Toe Raise: 15 reps  Modalities Modalities: Cryotherapy Cryotherapy Number Minutes Cryotherapy: 10 Minutes Cryotherapy Location: Knee (Posterior knee) Type of Cryotherapy: Ice pack  Physical Therapy Assessment and Plan PT Assessment and Plan Clinical Impression Statement: Pt completes therex well after initial cueing and demo. Pt requires multimodal cueing to isolate ankle motion on rockerboard and BAPS board. Ice applied to posterior knee at pt's request at end of session. Pt will benefit from skilled therapeutic intervention in order to improve on the following deficits: Pain;Decreased activity tolerance;Increased edema;Decreased range of motion;Decreased balance;Decreased strength;Difficulty walking Rehab Potential: Good PT Frequency: Min 3X/week PT Duration: 4 weeks PT Treatment/Interventions: Gait training;Therapeutic activities;Therapeutic exercise;Manual techniques;Modalities PT Plan: Continue to progress ankle strength and stability per PT POC.    Problem List Patient Active Problem List   Diagnosis Date Noted  . Stiffness of joint, not elsewhere classified, ankle and foot 07/22/2013  . Pain in joint, ankle and foot  07/22/2013  . Difficulty in walking(719.7) 07/22/2013  . Myalgia and myositis   . NEOPLASM, SKIN, UNCERTAIN BEHAVIOR 67/20/9470  . ACTINIC KERATOSIS 08/18/2008  . DIABETES MELLITUS, TYPE II 12/01/2007  . HYPERLIPIDEMIA 12/01/2007  . OBESITY 12/01/2007  . ALLERGIC RHINITIS 12/01/2007  . GERD 12/01/2007  . ARTHRITIS 12/01/2007  . ACHILLES TENDINITIS, BILATERAL 12/01/2007  . INCREASED BLOOD PRESSURE 12/01/2007    PT - End of Session Activity Tolerance: Patient tolerated treatment well General Behavior During Therapy: [facility] for tasks assessed/performed  Rachelle Hora, PTA 08/04/2013, 12:13 PM

## 2013-08-06 ENCOUNTER — Ambulatory Visit (HOSPITAL_COMMUNITY): Payer: 59

## 2013-08-13 ENCOUNTER — Ambulatory Visit (HOSPITAL_COMMUNITY)
Admission: RE | Admit: 2013-08-13 | Discharge: 2013-08-13 | Disposition: A | Discharge status: Home / Self Care | Payer: 59 | Source: Ambulatory Visit | Attending: Family Medicine | Admitting: Family Medicine

## 2013-08-13 NOTE — Progress Notes (Signed)
Physical Therapy Treatment Patient Details  Name: Carolyn Stone MRN: [id number] Date of Birth: 04-11-1956  Today's Date: 08/13/2013 Time: 1018-1100 PT Time Calculation (min): 42 min Charges: Therex x 28'(1020-1048) Ice x 10'(1050-1100)  Visit#: 5 of 12  Re-eval: 08/21/13  Authorization: [employer]    Subjective: Symptoms/Limitations Symptoms: Pt reports no fall since last session. Pain Assessment Currently in Pain?: No/denies  Exercise/Treatments Machines for Strengthening Cybex Leg Press: Plantar flexion RLE only x 10 Ankle Exercises - Standing SLS: RLE: 3x15" HHA PRN Rocker Board: 2 minutes;Limitations Rocker Board Limitations: anterior/posterior right/left Heel Raises: 15 reps Toe Raise: 15 reps Heel Walk (Round Trip): 2RT  Ice x 10' to right posterior distal knee  Physical Therapy Assessment and Plan PT Assessment and Plan Clinical Impression Statement: Pt continues to progress well. Pt completes therex with min-mod difficulty after initial cueing and demo. Began plantarflexion on cybex to improve strength and control. ice applied to posterior knee as pt's states MD advised her to avoid ice directly to peroneal tendon. Pt reports 0/10 pain at end of session. Pt will benefit from skilled therapeutic intervention in order to improve on the following deficits: Pain;Decreased activity tolerance;Increased edema;Decreased range of motion;Decreased balance;Decreased strength;Difficulty walking Rehab Potential: Good PT Frequency: Min 3X/week PT Duration: 4 weeks PT Treatment/Interventions: Gait training;Therapeutic activities;Therapeutic exercise;Manual techniques;Modalities PT Plan: Progress balance and ankle strategy per PT POC.    Problem List Patient Active Problem List   Diagnosis Date Noted  . Stiffness of joint, not elsewhere classified, ankle and foot 07/22/2013  . Pain in joint, ankle and foot 07/22/2013  . Difficulty in walking(719.7) 07/22/2013  . Myalgia and  myositis   . NEOPLASM, SKIN, UNCERTAIN BEHAVIOR 42/35/3614  . ACTINIC KERATOSIS 08/18/2008  . DIABETES MELLITUS, TYPE II 12/01/2007  . HYPERLIPIDEMIA 12/01/2007  . OBESITY 12/01/2007  . ALLERGIC RHINITIS 12/01/2007  . GERD 12/01/2007  . ARTHRITIS 12/01/2007  . ACHILLES TENDINITIS, BILATERAL 12/01/2007  . INCREASED BLOOD PRESSURE 12/01/2007    PT - End of Session Activity Tolerance: Patient tolerated treatment well General Behavior During Therapy: [facility] for tasks assessed/performed  Rachelle Hora, PTA  08/13/2013, 12:05 PM

## 2013-08-18 ENCOUNTER — Inpatient Hospital Stay (HOSPITAL_COMMUNITY)
Admission: RE | Admit: 2013-08-18 | Discharge: 2013-08-18 | Disposition: A | Discharge status: Home / Self Care | Payer: 59 | Source: Ambulatory Visit | Attending: Physical Therapy | Admitting: Physical Therapy

## 2013-08-18 ENCOUNTER — Ambulatory Visit (HOSPITAL_COMMUNITY)
Admission: RE | Admit: 2013-08-18 | Discharge: 2013-08-18 | Disposition: A | Discharge status: Home / Self Care | Payer: 59 | Source: Ambulatory Visit | Attending: Family Medicine | Admitting: Family Medicine

## 2013-08-18 NOTE — Progress Notes (Signed)
Physical Therapy Treatment Patient Details  Name: Carolyn Stone MRN: [id number] Date of Birth: 1956/01/06  Today's Date: 08/18/2013 Time: 0932-3557 PT Time Calculation (min): 44 min Charges: Therex x 30' [phone]) Manual x 10' [phone])  Visit#: 5 of 12  Re-eval: 08/21/13  Authorization: [employer]    Subjective: Symptoms/Limitations Symptoms: Pt states that she forgot to wear brace to work yesterday and she was very sorenest night. Pain Assessment Currently in Pain?: Yes Pain Score: 3  Pain Location: Ankle Pain Orientation: Right   Exercise/Treatments Ankle Exercises - Standing SLS: RLE: 3x30" HHA PRN Rocker Board: 2 minutes;Limitations Rocker Board Limitations: anterior/posterior right/left Heel Raises: 15 reps Toe Raise: 15 reps Other Standing Ankle Exercises: Tandem 2RT  Manual Therapy Myofascial Release: (MFR) to scar and lateral calf musculature to decrease tightness and adhesions   Physical Therapy Assessment and Plan PT Assessment and Plan Clinical Impression Statement: Therapist facelifted actives to improve ankle strength and stability. Pt requires cueing to improve focus with balance activities. MFR completed to lateral calf musculature and scar to decrease fascial restrictions and pain. Pt reports pain decrease to 0/10 at end of session. Pt will benefit from skilled therapeutic intervention in order to improve on the following deficits: Pain;Decreased activity tolerance;Increased edema;Decreased range of motion;Decreased balance;Decreased strength;Difficulty walking Rehab Potential: Good PT Frequency: Min 3X/week PT Duration: 4 weeks PT Treatment/Interventions: Gait training;Therapeutic activities;Therapeutic exercise;Manual techniques;Modalities PT Plan: Continue with strengthening and balance activities. Use manual techniques as needed.    Problem List Patient Active Problem List   Diagnosis Date Noted  . Stiffness of joint, not elsewhere classified,  ankle and foot 07/22/2013  . Pain in joint, ankle and foot 07/22/2013  . Difficulty in walking(719.7) 07/22/2013  . Myalgia and myositis   . NEOPLASM, SKIN, UNCERTAIN BEHAVIOR 83/15/1761  . ACTINIC KERATOSIS 08/18/2008  . DIABETES MELLITUS, TYPE II 12/01/2007  . HYPERLIPIDEMIA 12/01/2007  . OBESITY 12/01/2007  . ALLERGIC RHINITIS 12/01/2007  . GERD 12/01/2007  . ARTHRITIS 12/01/2007  . ACHILLES TENDINITIS, BILATERAL 12/01/2007  . INCREASED BLOOD PRESSURE 12/01/2007    PT - End of Session Activity Tolerance: Patient tolerated treatment well General Behavior During Therapy: [facility] for tasks assessed/performed  Rachelle Hora, PTA  08/18/2013, 12:17 PM

## 2013-08-20 ENCOUNTER — Ambulatory Visit (HOSPITAL_COMMUNITY)
Admission: RE | Admit: 2013-08-20 | Discharge: 2013-08-20 | Disposition: A | Discharge status: Home / Self Care | Payer: 59 | Source: Ambulatory Visit | Attending: Family Medicine | Admitting: Family Medicine

## 2013-08-20 DIAGNOSIS — M25579 Pain in unspecified ankle and joints of unspecified foot: Secondary | ICD-10-CM

## 2013-08-20 DIAGNOSIS — R262 Difficulty in walking, not elsewhere classified: Secondary | ICD-10-CM

## 2013-08-20 DIAGNOSIS — M25676 Stiffness of unspecified foot, not elsewhere classified: Principal | ICD-10-CM

## 2013-08-20 DIAGNOSIS — M25673 Stiffness of unspecified ankle, not elsewhere classified: Secondary | ICD-10-CM

## 2013-08-20 NOTE — Evaluation (Signed)
Physical Therapy Evaluation  Patient Details  Name: Carolyn Stone MRN: [id number] Date of Birth: [birth year]  Today's Date: 08/20/2013 Time: 1020-1100 PT Time Calculation (min): 40 min Charge:  Mm test 1020-1030; there ex 1030-1040; self care 1040-1100             Visit#: 6 of 12  Re-eval: 08/21/13 Assessment Diagnosis: s/p Rt peroneal tendon repair Surgical Date: 05/22/14 Next MD Visit: 07/27/2013 Prior Therapy: one [place] visit  Authorization: [facility]    Past Medical History:  Past Medical History  Diagnosis Date  . Allergy   . Arthritis   . Anxiety   . Myalgia and myositis     "undetermined inflammatory process"   Past Surgical History:  Past Surgical History  Procedure Laterality Date  . Cesarean section    . Dilation and curettage of uterus      x2  . Carpal tunnel release Bilateral   . Foot surgery Left     Subjective Symptoms/Limitations Symptoms: Pt states she wears her brace 95% of the time.   How long can you sit comfortably?: sitting is no problem How long can you stand comfortably?: Pt able to stand any length she wants to now was 20 minutes. How long can you walk comfortably?: Walking with her brace; able to ambulate for 40 minutes, .  Was 10 minutes. Pain Assessment Currently in Pain?: Yes Pain Score: 0-No pain Pain Location: Ankle Pain Orientation: Right    Prior Functioning  Prior Function Vocation: Part time employment Vocation Requirements: sitting Leisure: Hobbies-yes (Comment) Comments: running, walking , photography, horseback riding   Sensation/Coordination/Flexibility/Functional Tests Functional Tests Functional Tests: FS 35  Assessment RLE AROM (degrees) Right Ankle Dorsiflexion: 10 (was 3) Right Ankle Plantar Flexion: 52 (was 45) Right Ankle Inversion: 30 (was 15) Right Ankle Eversion: 15 (was 10) RLE Strength Right Ankle Dorsiflexion:  (4+/5 was 3+/5) Right Ankle Plantar Flexion: 3+/5 (was 3+/5 ) Right Ankle Inversion: 5/5 (was  3+/5) Right Ankle Eversion: 4/5 (was 3-/5)  Exercise/Treatments Mobility/Balance  Static Standing Balance Single Leg Stance - Right Leg: 7 Single Leg Stance - Left Leg: 49   Ankle Exercises - Standing SLS: x5 Heel Raises: 15 reps   sidelying eversion x 10 with manual resistance. Physical Therapy Assessment and Plan PT Assessment and Plan Clinical Impression Statement: Pt has improved in all aspects.  Pt will benefit from continuing once every other week to make sure pt is progressing towards goals PT Frequency:  (1x every other week.) PT Duration: 4 weeks PT Treatment/Interventions: Therapeutic activities;Therapeutic exercise PT Plan: Pt to continue with HEP .  Pt would feel more comfortable if she came once every two weeks for two more visits to ensure she meets her goals    Goals Home Exercise Program Pt/caregiver will Perform Home Exercise Program: For increased ROM PT Goal: Perform Home Exercise Program - Progress: Met PT Short Term Goals Time to Complete Short Term Goals: 2 weeks PT Short Term Goal 1: Pt ROM to be wfl to allow a normal gait pattern once pt has adequate strength PT Short Term Goal 1 - Progress: Met PT Short Term Goal 2: strength to be improved 1/2 grade PT Short Term Goal 2 - Progress: Met PT Short Term Goal 3: Pt to be able to walk 1/2 mile without pain PT Short Term Goal 3 - Progress: Progressing toward goal PT Short Term Goal 4: Pain level to be no greater than a 2 80% of the day. PT Short Term Goal 4 -  Progress: Met PT Short Term Goal 5: PT to be usng the boot only 50% of the day. PT Short Term Goal 5 - Progress: Met PT Long Term Goals Time to Complete Long Term Goals: 4 weeks PT Long Term Goal 1: Pt Strength to be improved one grade to allow normal ambulation without boot. PT Long Term Goal 1 - Progress: Met PT Long Term Goal 2: Pt to be able to stand for an hour to make an indepth meal  PT Long Term Goal 2 - Progress: Met Long Term Goal 3: Pt to  be able to walk for a mile without any increase pain Long Term Goal 3 Progress: Progressing toward goal Long Term Goal 4: Pain level to be at  0a 80% of the time Long Term Goal 4 Progress: Not met PT Long Term Goal 5: Pt to be able to ride horses Long Term Goal 5 Progress: Not met  Problem List Patient Active Problem List   Diagnosis Date Noted  . Stiffness of joint, not elsewhere classified, ankle and foot 07/22/2013  . Pain in joint, ankle and foot 07/22/2013  . Difficulty in walking(719.7) 07/22/2013  . Myalgia and myositis   . NEOPLASM, SKIN, UNCERTAIN BEHAVIOR 66/10/3014  . ACTINIC KERATOSIS 08/18/2008  . DIABETES MELLITUS, TYPE II 12/01/2007  . HYPERLIPIDEMIA 12/01/2007  . OBESITY 12/01/2007  . ALLERGIC RHINITIS 12/01/2007  . GERD 12/01/2007  . ARTHRITIS 12/01/2007  . ACHILLES TENDINITIS, BILATERAL 12/01/2007  . INCREASED BLOOD PRESSURE 12/01/2007    PT - End of Session Activity Tolerance: Patient tolerated treatment well General Behavior During Therapy: [facility] for tasks assessed/performed PT Plan of Care PT Home Exercise Plan: new given  GP    Eliga Arvie,CINDY 08/20/2013, 1:56 PM  Physician Documentation Your signature is required to indicate approval of the treatment plan as stated above.  Please sign and either send electronically or make a copy of this report for your files and return this physician signed original.   Please mark one 1.__approve of plan  2. ___approve of plan with the following conditions.   ______________________________                                                          _____________________ Physician Signature                                                                                                             Date

## 2013-08-24 ENCOUNTER — Ambulatory Visit (INDEPENDENT_AMBULATORY_CARE_PROVIDER_SITE_OTHER): Payer: 59 | Admitting: Podiatry

## 2013-08-24 ENCOUNTER — Encounter: Payer: Self-pay | Admitting: Podiatry

## 2013-08-24 VITALS — BP 138/90 | HR 84 | Resp 16

## 2013-08-24 DIAGNOSIS — Z9889 Other specified postprocedural states: Secondary | ICD-10-CM

## 2013-08-24 NOTE — Progress Notes (Signed)
She presents today after attending physical therapy and states she's doing much better. She's referring to the peroneal tendon repair right foot and the Achilles repair from previous years. At this point she's doing very well.  Objective: Vital signs are stable she is alert and oriented x3. She has minimal tenderness on palpation of the peroneal tendons of her right foot.  Assessment: Well-healing surgical foot right date of surgery December 2014.  Plan: Continue with physical therapy for another month or so then discontinue. Continue to wear her brace as needed. Followup with me as needed.

## 2013-10-15 NOTE — Addendum Note (Signed)
Encounter addended by: Leeroy Cha, PT on: 10/15/2013  1:16 PM<BR>     Documentation filed: Notes Section, Episodes

## 2013-10-15 NOTE — Progress Notes (Signed)
  Patient Details  Name: Carolyn Stone MRN: [id number] Date of Birth: 1955/11/03  Today's Date: 10/15/2013 Discharge pt.  Pt is doing well and does not feel she needs any further PT>  Leeroy Cha 10/15/2013, 1:14 PM

## 2013-11-10 ENCOUNTER — Other Ambulatory Visit: Payer: Self-pay | Admitting: Family Medicine

## 2013-11-20 ENCOUNTER — Ambulatory Visit (INDEPENDENT_AMBULATORY_CARE_PROVIDER_SITE_OTHER): Payer: 59 | Admitting: Nurse Practitioner

## 2013-11-20 ENCOUNTER — Encounter: Payer: Self-pay | Admitting: Nurse Practitioner

## 2013-11-20 VITALS — BP 128/86 | Ht 62.0 in | Wt 199.4 lb

## 2013-11-20 DIAGNOSIS — K219 Gastro-esophageal reflux disease without esophagitis: Secondary | ICD-10-CM

## 2013-11-20 DIAGNOSIS — R748 Abnormal levels of other serum enzymes: Secondary | ICD-10-CM

## 2013-11-20 DIAGNOSIS — R7301 Impaired fasting glucose: Secondary | ICD-10-CM

## 2013-11-20 DIAGNOSIS — Z23 Encounter for immunization: Secondary | ICD-10-CM

## 2013-11-20 DIAGNOSIS — M129 Arthropathy, unspecified: Secondary | ICD-10-CM

## 2013-11-20 LAB — HM MAMMOGRAPHY

## 2013-11-20 MED ORDER — METFORMIN HCL 500 MG PO TABS: 500.0000 mg | ORAL_TABLET | Freq: Two times a day (BID) | ORAL | Status: DC

## 2013-11-20 MED ORDER — CELECOXIB 200 MG PO CAPS: 200.0000 mg | ORAL_CAPSULE | Freq: Two times a day (BID) | ORAL | Status: DC

## 2013-11-20 MED ORDER — PANTOPRAZOLE SODIUM 40 MG PO TBEC
DELAYED_RELEASE_TABLET | ORAL | Status: DC
Start: 2013-11-20 — End: 2014-12-08

## 2013-11-20 NOTE — Patient Instructions (Signed)
One Metformin with supper; if tolerated after 2 weeks increase to twice a day with meals

## 2013-11-25 ENCOUNTER — Encounter: Payer: Self-pay | Admitting: Nurse Practitioner

## 2013-11-25 NOTE — Progress Notes (Signed)
Subjective:  Presents for followup. Patient is unsure about which lab work needs to be repeated. Has trouble going to sleep at nighttime. Exhausted at the end of the day, takes about a one-hour nap in the evening when she gets off work. Then has difficulty going to sleep at night. Has tried Mobic for her chronic arthritis pain, had to stop this due to swelling of her legs. Minimal caffeine intake. Gets regular preventive health physicals with her gynecologist, had a bone density about one year ago which was normal according to patient. No chest pain/ischemic type pain or unusual shortness of breath. No further edema since stopping Mobic. Reflux stable.  Objective:   BP 128/86  Ht 5\' 2"  (1.575 m)  Wt 199 lb 6.4 oz (90.447 kg)  BMI 36.46 kg/m2 NAD. Alert, oriented. Lungs clear. Heart regular rate rhythm. Abdomen soft nondistended nontender. Lower extremities no edema.  Assessment:  Problem List Items Addressed This Visit     Digestive   GERD   Relevant Medications      pantoprazole (PROTONIX) EC tablet     Endocrine   Impaired fasting glucose - Primary     Musculoskeletal and Integument   ARTHRITIS    Other Visit Diagnoses   Elevated liver enzymes        Relevant Orders       Hepatic function panel    Need for prophylactic vaccination with combined diphtheria-tetanus-pertussis (DTP) vaccine        Relevant Orders       Tdap vaccine greater than or equal to 7yo IM (Completed)       Plan:  Meds ordered this encounter  Medications  . celecoxib (CELEBREX) 200 MG capsule    Sig: Take 1 capsule (200 mg total) by mouth 2 (two) times daily. As needed    Dispense:  60 capsule    Refill:  2    Order Specific Question:  Supervising Provider    Answer:  Mikey Kirschner [[id number]]  . metFORMIN (GLUCOPHAGE) 500 MG tablet    Sig: Take 1 tablet (500 mg total) by mouth 2 (two) times daily with a meal.    Dispense:  60 tablet    Refill:  5    Order Specific Question:  Supervising Provider   Answer:  Mikey Kirschner [[id number]]  . pantoprazole (PROTONIX) 40 MG tablet    Sig: TAKE ONE TABLET BY MOUTH ONCE DAILY    Dispense:  30 tablet    Refill:  5    Order Specific Question:  Supervising Provider    Answer:  Mikey Kirschner [[id number]]   If liver enzymes remain significantly elevated, recommend further workup, patient agrees. Add metformin to her regimen as directed. Encouraged activity as tolerated, weight loss, healthy diet and daily vitamin D and calcium supplementation. Recommended patient stop her naps in the evening to help her get back on a regular sleep schedule. Return in about 6 months (around 05/22/2014). Call back sooner if any problems.

## 2014-01-20 ENCOUNTER — Ambulatory Visit: Payer: 59 | Admitting: Nurse Practitioner

## 2014-03-03 ENCOUNTER — Ambulatory Visit (INDEPENDENT_AMBULATORY_CARE_PROVIDER_SITE_OTHER): Payer: 59 | Admitting: Podiatry

## 2014-03-03 ENCOUNTER — Ambulatory Visit (INDEPENDENT_AMBULATORY_CARE_PROVIDER_SITE_OTHER): Payer: 59

## 2014-03-03 VITALS — BP 138/77 | HR 82 | Resp 16

## 2014-03-03 DIAGNOSIS — M779 Enthesopathy, unspecified: Secondary | ICD-10-CM

## 2014-03-03 DIAGNOSIS — M7670 Peroneal tendinitis, unspecified leg: Secondary | ICD-10-CM

## 2014-03-03 DIAGNOSIS — M7672 Peroneal tendinitis, left leg: Secondary | ICD-10-CM

## 2014-03-03 NOTE — Progress Notes (Signed)
She presents today for another bout of pain to the lateral peroneals of her left foot and ankle. She denies any trauma. She states that she has increase her ambulation.  Objective: She has pain on palpation of the peroneal longus a prone is brevis tendons of the left foot. Radiographic evaluation does not demonstrate any type of osseous abnormalities.  Assessment: Peroneal tendinitis care rule out tear left.  Plan: I injected her left peroneal tendons today with Kenalog and local anesthetic. And I encouraged her to wear her Cam Walker. I will followup with her in 3-4 weeks if she is no better.

## 2014-04-02 ENCOUNTER — Telehealth: Payer: Self-pay | Admitting: *Deleted

## 2014-04-02 NOTE — Telephone Encounter (Signed)
Called pt got recording that phone # was either changed, disconnected or no longer in service. Pt called and left message stating she wanted a mri.

## 2014-04-12 ENCOUNTER — Ambulatory Visit (INDEPENDENT_AMBULATORY_CARE_PROVIDER_SITE_OTHER): Payer: 59 | Admitting: Podiatry

## 2014-04-12 VITALS — BP 122/77 | HR 80 | Resp 16

## 2014-04-12 DIAGNOSIS — M7672 Peroneal tendinitis, left leg: Secondary | ICD-10-CM

## 2014-04-12 DIAGNOSIS — M7670 Peroneal tendinitis, unspecified leg: Secondary | ICD-10-CM

## 2014-04-12 NOTE — Progress Notes (Signed)
She presents today for follow-up of her peroneal tendon tendinitis left foot. She was provided with an injection last visit and states that it did not help at all. She has been wearing her cam walker for the past 2 weeks with no changes in the level of discomfort.  Objective: Vital signs are stable she is alert and oriented 3 she has severe swelling overlying the lateral aspect of the peroneal region of the left ankle. It extends from supramalleolar region extending inferiorly to the fifth metatarsal base area. She also relates pain in the peroneal muscle of the left leg. Pulses are strongly palpable there is no calf pain.  Assessment: Failure to improve with conservative therapies for repair and nail longus tendinitis left.  Plan: An MRI of the foot and ankle are necessary at this point to confirm split tear of the peroneal longus tendon.

## 2014-04-14 ENCOUNTER — Encounter: Payer: Self-pay | Admitting: Podiatry

## 2014-04-14 NOTE — Progress Notes (Unsigned)
Subjective:     Patient ID: Carolyn Stone, female   DOB: 04-Feb-1956, 58 y.o.   MRN: [id number]  HPI   Review of Systems     Objective:   Physical Exam     Assessment:     ***    Plan:     ***

## 2014-04-15 ENCOUNTER — Ambulatory Visit: Payer: Self-pay | Admitting: Podiatry

## 2014-04-19 ENCOUNTER — Encounter: Payer: Self-pay | Admitting: Podiatry

## 2014-04-19 ENCOUNTER — Telehealth: Payer: Self-pay | Admitting: Podiatry

## 2014-04-19 NOTE — Telephone Encounter (Signed)
LEFT MESSAGE FOR PATIENT TO CALL AND SET UP APPT WITH DR.HYATT

## 2014-05-05 ENCOUNTER — Ambulatory Visit (INDEPENDENT_AMBULATORY_CARE_PROVIDER_SITE_OTHER): Payer: 59 | Admitting: Podiatry

## 2014-05-05 DIAGNOSIS — M7670 Peroneal tendinitis, unspecified leg: Secondary | ICD-10-CM

## 2014-05-05 DIAGNOSIS — M7672 Peroneal tendinitis, left leg: Secondary | ICD-10-CM

## 2014-05-05 NOTE — Progress Notes (Signed)
She presents today for her MRI results. She states that her left ankle has been hurting worse.  Objective: Vital signs are stable she is alert and oriented 3. She has failed palpation of the peroneus brevis tendon left. MRI does demonstrate a longitudinal split tear of the inferior most portion of the tendon.  Assessment: Peroneal tendon tear left.  Plan: We went over a consent form today line by line number number giving her ample time to ask questions she self a regarding an primary repair of the peroneus brevis tendon left. I answered all the questions regarding this procedure the best my ability in layman's terms. She understands this is amenable to it signed all 3 pages of the consent form. She has previously had the contralateral peroneal tendon repaired as well so she understands this thoroughly. She understands that she will also be casted.

## 2014-05-07 ENCOUNTER — Telehealth: Payer: Self-pay | Admitting: Family Medicine

## 2014-05-07 ENCOUNTER — Other Ambulatory Visit: Payer: Self-pay | Admitting: Nurse Practitioner

## 2014-05-07 DIAGNOSIS — R7301 Impaired fasting glucose: Secondary | ICD-10-CM

## 2014-05-07 DIAGNOSIS — E785 Hyperlipidemia, unspecified: Secondary | ICD-10-CM

## 2014-05-07 DIAGNOSIS — R748 Abnormal levels of other serum enzymes: Secondary | ICD-10-CM

## 2014-05-07 DIAGNOSIS — Z130 Encounter for screening for diseases of the blood and blood-forming organs and certain disorders involving the immune mechanism: Secondary | ICD-10-CM

## 2014-05-07 NOTE — Telephone Encounter (Signed)
Patient is due to have foot surgery again.  She is due to have her hepatic function checked with our office.  She wants to know if we can add to check hemoglobin and Met 1 so that she does not have to get stuck 2 times.  These are the labs that the foot doctor will need done before surgery.

## 2014-05-07 NOTE — Telephone Encounter (Signed)
I ordered all of her labs including yearly labs that she needs. Remind her to do labs fasting.

## 2014-05-07 NOTE — Telephone Encounter (Signed)
See other note

## 2014-05-07 NOTE — Telephone Encounter (Signed)
Does patient need to have additional lab work done for her upcoming appointment on 05/19/2014?  This is a pre-surgery appointment.

## 2014-05-07 NOTE — Telephone Encounter (Signed)
Patient notified and verbalized understanding. 

## 2014-05-09 LAB — LIPID PANEL
Cholesterol: 226 mg/dL — ABNORMAL HIGH (ref 0–200)
HDL: 44 mg/dL (ref >39)
LDL Cholesterol: 157 mg/dL — ABNORMAL HIGH (ref 0–99)
Total CHOL/HDL Ratio: 5.1 Ratio
Triglycerides: 127 mg/dL (ref <150)
VLDL: 25 mg/dL (ref 0–40)

## 2014-05-09 LAB — CBC WITH DIFFERENTIAL/PLATELET
Basophils Absolute: 0 10*3/uL (ref 0.0–0.1)
Basophils Relative: 0 % (ref 0–1)
EOS PCT: 4 % (ref 0–5)
Eosinophils Absolute: 0.3 10*3/uL (ref 0.0–0.7)
HCT: 43 % (ref 36.0–46.0)
HEMOGLOBIN: 14.5 g/dL (ref 12.0–15.0)
LYMPHS ABS: 2.2 10*3/uL (ref 0.7–4.0)
Lymphocytes Relative: 31 % (ref 12–46)
MCH: 30.5 pg (ref 26.0–34.0)
MCHC: 33.7 g/dL (ref 30.0–36.0)
MCV: 90.5 fL (ref 78.0–100.0)
MPV: 9.2 fL — ABNORMAL LOW (ref 9.4–12.4)
Monocytes Absolute: 0.4 10*3/uL (ref 0.1–1.0)
Monocytes Relative: 5 % (ref 3–12)
Neutro Abs: 4.3 10*3/uL (ref 1.7–7.7)
Neutrophils Relative %: 60 % (ref 43–77)
Platelets: 285 10*3/uL (ref 150–400)
RBC: 4.75 MIL/uL (ref 3.87–5.11)
RDW: 13.3 % (ref 11.5–15.5)
WBC: 7.2 10*3/uL (ref 4.0–10.5)

## 2014-05-09 LAB — HEPATIC FUNCTION PANEL
ALBUMIN: 4.2 g/dL (ref 3.5–5.2)
ALT: 32 U/L (ref 0–35)
AST: 29 U/L (ref 0–37)
Alkaline Phosphatase: 33 U/L — ABNORMAL LOW (ref 39–117)
BILIRUBIN DIRECT: 0.2 mg/dL (ref 0.0–0.3)
BILIRUBIN TOTAL: 0.9 mg/dL (ref 0.2–1.2)
Indirect Bilirubin: 0.7 mg/dL (ref 0.2–1.2)
Total Protein: 8.1 g/dL (ref 6.0–8.3)

## 2014-05-09 LAB — BASIC METABOLIC PANEL
BUN: 10 mg/dL (ref 6–23)
CALCIUM: 9.9 mg/dL (ref 8.4–10.5)
CO2: 26 mEq/L (ref 19–32)
Chloride: 102 mEq/L (ref 96–112)
Creat: 0.79 mg/dL (ref 0.50–1.10)
GLUCOSE: 104 mg/dL — AB (ref 70–99)
Potassium: 4.5 mEq/L (ref 3.5–5.3)
Sodium: 139 mEq/L (ref 135–145)

## 2014-05-09 LAB — HEMOGLOBIN A1C
HEMOGLOBIN A1C: 5.7 % — AB (ref <5.7)
Mean Plasma Glucose: 117 mg/dL — ABNORMAL HIGH (ref <117)

## 2014-05-19 ENCOUNTER — Encounter: Payer: Self-pay | Admitting: Nurse Practitioner

## 2014-05-19 ENCOUNTER — Ambulatory Visit (INDEPENDENT_AMBULATORY_CARE_PROVIDER_SITE_OTHER): Payer: 59 | Admitting: Nurse Practitioner

## 2014-05-19 ENCOUNTER — Telehealth: Payer: Self-pay | Admitting: Nurse Practitioner

## 2014-05-19 ENCOUNTER — Other Ambulatory Visit: Payer: Self-pay | Admitting: *Deleted

## 2014-05-19 VITALS — BP 150/98 | Ht 62.0 in | Wt 197.0 lb

## 2014-05-19 DIAGNOSIS — R1032 Left lower quadrant pain: Secondary | ICD-10-CM

## 2014-05-19 DIAGNOSIS — R350 Frequency of micturition: Secondary | ICD-10-CM

## 2014-05-19 LAB — POCT URINALYSIS DIPSTICK
SPEC GRAV UA: 1.025
pH, UA: 8

## 2014-05-19 MED ORDER — METFORMIN HCL 500 MG PO TABS: 500.0000 mg | ORAL_TABLET | Freq: Two times a day (BID) | ORAL | Status: DC

## 2014-05-19 NOTE — Telephone Encounter (Signed)
Med sent to pharm. Pt notified on voicemail.  

## 2014-05-19 NOTE — Telephone Encounter (Signed)
Patient needs Rx for metformin to [facility].

## 2014-05-23 ENCOUNTER — Encounter: Payer: Self-pay | Admitting: Nurse Practitioner

## 2014-05-23 NOTE — Progress Notes (Signed)
Subjective:  Presents for complaints of lower abdominal pain off-and-on for the past 2 weeks. Some urinary frequency but no dysuria or fever. Regular follow-up with her gynecologist. Has been diagnosed with HPV. Getting periodic Pap smears for monitoring. Pain is mainly in the left lower quadrant. Had to decrease her metformin to once a day, could not tolerate twice a day dosing. Has been taking Percocet for pain, has developed slight constipation. No blood in her stool. No change in the color of her stools. No nausea vomiting. Postmenopausal, no vaginal bleeding. Same sexual partner.  Objective:   BP 150/98 mmHg  Ht 5\' 2"  (1.575 m)  Wt 197 lb (89.359 kg)  BMI 36.02 kg/m2 NAD. Alert, oriented. Lungs clear. Heart regular rate rhythm. Abdomen soft nondistended with active bowel sounds 4; distinct tenderness in the left lower quadrant of the abdomen. No specific pelvic tenderness noted. Also moderate tenderness to palpation in the left middle quadrant just above LLQ. A few small mobile nodules noted consistent with fatty tissue. Results for orders placed or performed in visit on 05/19/14  POCT urinalysis dipstick  Result Value Ref Range   Color, UA     Clarity, UA     Glucose, UA     Bilirubin, UA     Ketones, UA     Spec Grav, UA 1.025    Blood, UA     pH, UA 8.0    Protein, UA     Urobilinogen, UA     Nitrite, UA     Leukocytes, UA       Assessment: LLQ abdominal pain - Plan: CT Abdomen W Contrast, CT Abdomen Pelvis W Contrast  Urinary frequency - Plan: POCT urinalysis dipstick  Plan:  Meds ordered this encounter  Medications  . oxyCODONE-acetaminophen (PERCOCET) 10-325 MG per tablet    Sig: Take 1 tablet by mouth every 4 (four) hours as needed for pain.   CT scan of the abdomen and pelvis ordered but unable to do today due to holiday. Warning signs reviewed with patient. To seek help immediately if symptoms worsen. Otherwise scan is scheduled first 12/28. Continue follow-up with  gynecology.

## 2014-05-24 ENCOUNTER — Ambulatory Visit (HOSPITAL_COMMUNITY)
Admission: RE | Admit: 2014-05-24 | Discharge: 2014-05-24 | Disposition: A | Discharge status: Home / Self Care | Payer: 59 | Source: Ambulatory Visit | Attending: Nurse Practitioner | Admitting: Nurse Practitioner

## 2014-05-24 DIAGNOSIS — R911 Solitary pulmonary nodule: Secondary | ICD-10-CM | POA: Diagnosis not present

## 2014-05-24 DIAGNOSIS — R1032 Left lower quadrant pain: Secondary | ICD-10-CM

## 2014-05-24 DIAGNOSIS — K760 Fatty (change of) liver, not elsewhere classified: Secondary | ICD-10-CM | POA: Insufficient documentation

## 2014-05-24 MED ORDER — SODIUM CHLORIDE 0.9 % IJ SOLN
INTRAMUSCULAR | Status: AC
  Filled 2014-05-24: qty 250

## 2014-05-24 MED ORDER — SODIUM CHLORIDE 0.9 % IJ SOLN
INTRAMUSCULAR | Status: AC
  Filled 2014-05-24: qty 45

## 2014-05-24 MED ORDER — IOHEXOL 300 MG/ML  SOLN
100.0000 mL | Freq: Once | INTRAMUSCULAR | Status: AC | PRN
  Administered 2014-05-24: 100 mL via INTRAVENOUS

## 2014-05-27 ENCOUNTER — Encounter: Payer: Self-pay | Admitting: Podiatry

## 2014-05-27 DIAGNOSIS — M7670 Peroneal tendinitis, unspecified leg: Secondary | ICD-10-CM

## 2014-06-02 ENCOUNTER — Encounter: Payer: Self-pay | Admitting: Podiatry

## 2014-06-02 NOTE — Progress Notes (Signed)
Dr Milinda Pointer performed a primary repair of peroneus brevis tendon tear, left foot with cast application on 61/51/83

## 2014-06-03 ENCOUNTER — Ambulatory Visit (INDEPENDENT_AMBULATORY_CARE_PROVIDER_SITE_OTHER): Payer: Commercial Managed Care - PPO

## 2014-06-03 ENCOUNTER — Ambulatory Visit (INDEPENDENT_AMBULATORY_CARE_PROVIDER_SITE_OTHER): Payer: Commercial Managed Care - PPO | Admitting: Podiatry

## 2014-06-03 VITALS — BP 140/89 | HR 84 | Temp 96.8°F | Resp 16

## 2014-06-03 DIAGNOSIS — Z9889 Other specified postprocedural states: Secondary | ICD-10-CM

## 2014-06-03 DIAGNOSIS — M7672 Peroneal tendinitis, left leg: Secondary | ICD-10-CM

## 2014-06-03 MED ORDER — FLUCONAZOLE 150 MG PO TABS: 150.0000 mg | ORAL_TABLET | Freq: Every day | ORAL | Status: DC

## 2014-06-06 NOTE — Progress Notes (Signed)
Patient ID: Carolyn Stone, female   DOB: 1955/11/12, 59 y.o.   MRN: [id number]  Subjective: 59 year old female returns the office today one-week status post left peroneal tendon repair. She states that she history well and she has been nonweightbearing as directed. She states of the cast is fitting well without any discomfort or problems. She's been taking the antibiotics as directed however she is requesting something for a yeast infection. Her pain is currently controlled and is not required pain medication. She denies any systemic complaints as fevers, chills, nausea, vomiting. No chest pain, soreness of breath, calf pain. No other complaints at this time in no acute changes.  Objective: AAO 3, NAD Capillary refill time is intact all digits. There is not appear to be any swelling to the digits. Motor function is intact all digits. There is no pain with calf compression, or any apparent edema. The cast appears to be fitting well without any complications and is clean, dry, intact.  Assessment: 59 year old female 1 week status post left peroneal tendon repair.  Plan: -X-ray was obtained and reviewed with the patient. -Treatment options were discussed with patient including alternatives, risks, complications. -Recommend continuing nonweightbearing. Keep the cast clean, dry, intact. Monitor for any complications of casting or if there is any problems with the cast to call the office or go to the ED. -Continue pain medication as needed. -Finish course of antibiotics. -Prescribed Diflucan. -Follow-up in one week for likely staple removal and cast change. In the meantime, encouraged to call the office with any questions, concerns, change in symptoms.

## 2014-06-09 ENCOUNTER — Ambulatory Visit (INDEPENDENT_AMBULATORY_CARE_PROVIDER_SITE_OTHER): Payer: Commercial Managed Care - PPO | Admitting: Podiatry

## 2014-06-09 VITALS — BP 147/82 | HR 83 | Temp 98.0°F | Resp 16

## 2014-06-09 DIAGNOSIS — Z9889 Other specified postprocedural states: Secondary | ICD-10-CM

## 2014-06-09 NOTE — Progress Notes (Signed)
She presents today 2 weeks status post peroneal tendon repair left. She denies fever chills nausea vomiting muscle aches and pains and states this seems to be doing better.  Objective: Vital signs are stable she's alert and oriented 3. Cast was intact was removed demonstrates margins well coapted and sutures are intact. No erythema edema cellulitis drainage or odor. Staples were removed and some will remain.  Assessment: Well-healing surgical foot left status post peroneal tendon repair 05/27/2014.  Plan: Redress today dressing compressive dressing application of a another below-the-knee cast. Follow up with her in 2 weeks

## 2014-06-16 ENCOUNTER — Telehealth: Payer: Self-pay | Admitting: *Deleted

## 2014-06-16 ENCOUNTER — Telehealth: Payer: Self-pay

## 2014-06-16 MED ORDER — OXYCODONE-ACETAMINOPHEN 10-325 MG PO TABS: 1.0000 | ORAL_TABLET | ORAL | Status: DC | PRN

## 2014-06-16 NOTE — Telephone Encounter (Signed)
Pt called stating that she needed to get a refill on her pain medication and she also needed to speak with someone regarding her foot pain

## 2014-06-16 NOTE — Telephone Encounter (Signed)
OK TO REFILL , PATIENT ABOUT TO RUN OUT AND STILL HAVING PAIN

## 2014-06-17 ENCOUNTER — Telehealth: Payer: Self-pay | Admitting: *Deleted

## 2014-06-17 NOTE — Telephone Encounter (Signed)
Pt states she is a [place] pt, bout would need the medication written by Dr. Milinda Pointer in [place], so her husband could pick up.  I informed pt the rx was refilled on 06/16/2014 and was waiting for her in [place] per [place].  Pt states she will get someone to pick it up in the [place] office.

## 2014-06-23 ENCOUNTER — Ambulatory Visit (INDEPENDENT_AMBULATORY_CARE_PROVIDER_SITE_OTHER): Payer: Commercial Managed Care - PPO | Admitting: Podiatry

## 2014-06-23 VITALS — BP 144/88 | HR 79 | Resp 16

## 2014-06-23 DIAGNOSIS — Z9889 Other specified postprocedural states: Secondary | ICD-10-CM

## 2014-06-23 MED ORDER — OXYCODONE-ACETAMINOPHEN 10-325 MG PO TABS: 1.0000 | ORAL_TABLET | Freq: Four times a day (QID) | ORAL | Status: DC | PRN

## 2014-06-24 NOTE — Progress Notes (Signed)
She presents today for follow-up of her peroneal tendon repair left. Date of surgery was 05/27/2014. She presents today nonweightbearing utilizing her crutches states that the pain is more on this side than the contralateral side.  Objective: Vital signs are stable she is alert and oriented 3. Cast was removed demonstrates staples are intact and was removed marked remaining well coapted as no erythema mild edema no cellulitis drainage or odor. She has good range of motion dorsiflexion flexors and inverters and everters. No signs of infection.  Assessment: Well-healing surgical foot peroneal tendon repair surgery 05/27/2014.  Plan: I removed her cast today and replaced it with a lighter cast. We will follow up with her in 2 weeks for removal of his cast and extremity placed in a Cam Walker. We will consider physical therapy.

## 2014-07-07 ENCOUNTER — Ambulatory Visit (INDEPENDENT_AMBULATORY_CARE_PROVIDER_SITE_OTHER): Payer: 59 | Admitting: Podiatry

## 2014-07-07 DIAGNOSIS — Z9889 Other specified postprocedural states: Secondary | ICD-10-CM

## 2014-07-07 NOTE — Progress Notes (Signed)
She presents today for follow-up of a peroneal repair date of surgery 05/27/2014. She states this seems to be doing fine sure if hers to the below-knee cast left.  Objective: Cast was intact was removed today demonstrates no erythema edema cellulitis drainage or odor she has good range of motion of the ankle and subtalar joint and mild tenderness on palpation of the distal most incision site.  Assessment: Well-healing surgical foot left.  Plan: At this point put her in a compression anklet and Cam Walker I want her to the walking without her crutches in 2 weeks. At that time we will place her in a Tri-Lock brace.

## 2014-07-21 ENCOUNTER — Ambulatory Visit (INDEPENDENT_AMBULATORY_CARE_PROVIDER_SITE_OTHER): Payer: 59 | Admitting: Podiatry

## 2014-07-21 ENCOUNTER — Encounter: Payer: Self-pay | Admitting: Podiatry

## 2014-07-21 VITALS — BP 138/81 | HR 86 | Resp 16

## 2014-07-21 DIAGNOSIS — M7672 Peroneal tendinitis, left leg: Secondary | ICD-10-CM

## 2014-07-21 DIAGNOSIS — Z9889 Other specified postprocedural states: Secondary | ICD-10-CM

## 2014-07-21 NOTE — Progress Notes (Signed)
She presents today 8 weeks status post peroneal tendon repair left. She states that it is still painful and she still needs medication. She is walking without her her crutches in her Cam Walker. She denies any changes in her past medical history medications allergies or surgical history.  Objective: Presents in her Cam Walker today. No ankle compression device. She has moderate edema at the surgical site along the lateral ankle but she has good motion against resistance just mild tenderness on palpation of the distal most aspect of the incision site. This is where the scar seems to be scarred down to deeper structures.  Assessment: Well-healing surgical peroneal tendon repair left. Residual symptoms associated with scar tissue.  Plan: I encouraged range of motion exercises and encouraged physical therapy. She would like to have this performed at a location near her home. She will notify us as to the location and have that location fax [place] in order for. I will follow-up with her in 3-4 weeks after physical therapy.

## 2014-08-09 ENCOUNTER — Ambulatory Visit (INDEPENDENT_AMBULATORY_CARE_PROVIDER_SITE_OTHER): Payer: 59 | Admitting: Podiatry

## 2014-08-09 ENCOUNTER — Encounter: Payer: Self-pay | Admitting: Podiatry

## 2014-08-09 VITALS — BP 143/81 | HR 81 | Resp 16

## 2014-08-09 DIAGNOSIS — M7672 Peroneal tendinitis, left leg: Secondary | ICD-10-CM

## 2014-08-09 DIAGNOSIS — Z9889 Other specified postprocedural states: Secondary | ICD-10-CM

## 2014-08-09 NOTE — Progress Notes (Signed)
Carolyn Stone presents today for follow-up of her peroneal tendon repair left. She states that is doing much better today than it was just a few days ago. She states that she thinks she overdid all walking her grandson. She also has not followed up with physical therapy as of yet.  Objective: Vital signs are stable she is alert and oriented 3. Pulses are palpable left. She has tenderness on palpation of the scar along the peroneal tendon incision site. However the tendon itself appears to be moving through the overlying skin and soft tissues are scarred down to the tendon itself. This is inclusive of the lateral cutaneous nerve.  Assessment well-healing surgical foot left.  Plan: Encouraged physical therapy. Follow up with her in 1 month after physical therapy is complete

## 2014-08-11 ENCOUNTER — Telehealth: Payer: Self-pay | Admitting: *Deleted

## 2014-08-11 NOTE — Telephone Encounter (Signed)
Patient called-requested PT be changed to [facility] in [place]. Wrote out new Rx for that and patient picked up.

## 2014-08-18 ENCOUNTER — Ambulatory Visit: Payer: 59 | Admitting: Podiatry

## 2014-09-06 ENCOUNTER — Ambulatory Visit: Payer: 59 | Admitting: Podiatry

## 2014-09-20 ENCOUNTER — Ambulatory Visit (INDEPENDENT_AMBULATORY_CARE_PROVIDER_SITE_OTHER): Payer: 59 | Admitting: Podiatry

## 2014-09-20 VITALS — BP 153/92 | HR 91 | Resp 16

## 2014-09-20 DIAGNOSIS — Z9889 Other specified postprocedural states: Secondary | ICD-10-CM

## 2014-09-20 DIAGNOSIS — M7671 Peroneal tendinitis, right leg: Secondary | ICD-10-CM

## 2014-09-20 NOTE — Progress Notes (Signed)
She presents today for follow-up of a peroneal tendon repair to her left ankle from December 2015. She's been visiting a local physical therapist whose been treating her for her chronic pain. She states that this is getting much better and she is actually doing a lot of walking and hiking.  Objective: Vital signs are stable she is alert and oriented 3 she has no tenderness on palpation of the left foot.  Assessment: Well-healing surgical site left.  Plan: Continue physical therapy should we are going to discharge her however from surgical care fairly because we can no longer help her with this problem. Should she have anything else, she will notify us immediately

## 2014-10-22 ENCOUNTER — Ambulatory Visit (INDEPENDENT_AMBULATORY_CARE_PROVIDER_SITE_OTHER): Payer: 59 | Admitting: Family Medicine

## 2014-10-22 ENCOUNTER — Encounter: Payer: Self-pay | Admitting: Family Medicine

## 2014-10-22 VITALS — BP 128/70 | Temp 97.9°F | Ht 62.0 in | Wt 202.0 lb

## 2014-10-22 DIAGNOSIS — R21 Rash and other nonspecific skin eruption: Secondary | ICD-10-CM

## 2014-10-22 MED ORDER — RANITIDINE HCL 300 MG PO TABS: 300.0000 mg | ORAL_TABLET | Freq: Every day | ORAL | Status: DC

## 2014-10-22 NOTE — Progress Notes (Signed)
   Subjective:    Patient ID: Carolyn Stone, female    DOB: Feb 10, 1956, 59 y.o.   MRN: [id number]  HPISwelling and a welt on left side of face. Came up today but it is gone now. Happened once before about 2 -3 weeks ago on the same spot.   Large red whelp came up lateral face  Indurated and hard  Fixed are sanme as before   Very itchy, some what itchy.  Using zyrtec and sudafed  Took an aleave last nite    Review of Systems No vomiting no diarrhea no cough no congestion    Objective:   Physical Exam  Alert no acute distress. Slight remnant induration left lateral cheek. Pharynx normal neck supple. Lungs clear. Heart regular in rhythm      Assessment & Plan:  Impression focal urticaria. 2 past couple weeks Etiology unclear. Of note had [place] yogurt before the first eruption. Plan daily nonsedating anti-histamine. Also daily Zantac 300 mg. Avoid Greek yogurt. If continues to recur will need allergy referral discussed WSL

## 2014-12-08 ENCOUNTER — Other Ambulatory Visit: Payer: Self-pay | Admitting: Nurse Practitioner

## 2014-12-08 ENCOUNTER — Other Ambulatory Visit: Payer: Self-pay | Admitting: Family Medicine

## 2014-12-23 ENCOUNTER — Encounter: Payer: Self-pay | Admitting: *Deleted

## 2015-01-12 ENCOUNTER — Telehealth: Payer: Self-pay | Admitting: Family Medicine

## 2015-01-12 MED ORDER — METFORMIN HCL 500 MG PO TABS: 500.0000 mg | ORAL_TABLET | Freq: Two times a day (BID) | ORAL | Status: DC

## 2015-01-12 NOTE — Telephone Encounter (Signed)
Patient says she will run out of her metFORMIN (GLUCOPHAGE) 500 MG tablet in 12 days.  She has an appointment on 01/26/2015 which is 14 days from now.  She wants to know could we call in this medication to last her those few days that she will not have it?

## 2015-01-12 NOTE — Telephone Encounter (Signed)
30 day supply sent to pharmacy. Patient was notified. 

## 2015-01-26 ENCOUNTER — Encounter: Payer: Self-pay | Admitting: Nurse Practitioner

## 2015-01-26 ENCOUNTER — Ambulatory Visit (INDEPENDENT_AMBULATORY_CARE_PROVIDER_SITE_OTHER): Payer: 59 | Admitting: Nurse Practitioner

## 2015-01-26 VITALS — BP 134/80 | Ht 62.0 in | Wt 194.2 lb

## 2015-01-26 DIAGNOSIS — K219 Gastro-esophageal reflux disease without esophagitis: Secondary | ICD-10-CM

## 2015-01-26 DIAGNOSIS — R7301 Impaired fasting glucose: Secondary | ICD-10-CM | POA: Diagnosis not present

## 2015-01-26 MED ORDER — DICLOFENAC SODIUM 1 % TD GEL: 2.0000 g | Freq: Four times a day (QID) | TRANSDERMAL | Status: AC

## 2015-01-27 ENCOUNTER — Encounter: Payer: Self-pay | Admitting: Nurse Practitioner

## 2015-01-27 NOTE — Progress Notes (Signed)
Subjective:   Presents for recheck. Reflux has been stable.  Takes Zantac on a regular basis.Does not take her Protonix daily, at this time takes about 2 times per week. Recently started Rickard Patience for weight loss. Has tried to increase her activity. Also requesting Voltaren gel for occasional pain. Tries to avoid oral anti-inflammatories.  Objective:   BP 134/80 mmHg  Ht 5\' 2"  (1.575 m)  Wt 194 lb 4 oz (88.111 kg)  BMI 35.52 kg/m2  NAD. Alert, oriented. Lungs clear. Heart regular rate rhythm. Abdomen soft nondistended nontender.  Assessment:  Problem List Items Addressed This Visit      Digestive   GERD - Primary     Endocrine   Impaired fasting glucose     Plan:  Meds ordered this encounter  Medications  . diclofenac sodium (VOLTAREN) 1 % GEL    Sig: Apply 2 g topically 4 (four) times daily. Prn pain    Dispense:  100 g    Refill:  0    Order Specific Question:  Supervising Provider    Answer:  Mikey Kirschner [[id number]]    limit Protonix as much as possible. Discussed new information considering possible long-term side effects. Continue weight loss efforts. Gets regular preventive health physicals. Return in about 6 months (around 07/26/2015) for recheck.

## 2015-02-25 ENCOUNTER — Other Ambulatory Visit: Payer: Self-pay | Admitting: Family Medicine

## 2015-04-01 ENCOUNTER — Encounter: Payer: Self-pay | Admitting: Family Medicine

## 2015-04-01 ENCOUNTER — Ambulatory Visit (INDEPENDENT_AMBULATORY_CARE_PROVIDER_SITE_OTHER): Payer: 59 | Admitting: Family Medicine

## 2015-04-01 VITALS — Temp 98.7°F | Ht 62.0 in | Wt 189.0 lb

## 2015-04-01 DIAGNOSIS — J019 Acute sinusitis, unspecified: Secondary | ICD-10-CM | POA: Diagnosis not present

## 2015-04-01 DIAGNOSIS — B9689 Other specified bacterial agents as the cause of diseases classified elsewhere: Secondary | ICD-10-CM

## 2015-04-01 DIAGNOSIS — R300 Dysuria: Secondary | ICD-10-CM | POA: Diagnosis not present

## 2015-04-01 LAB — POCT URINALYSIS DIPSTICK
Spec Grav, UA: 1.015
pH, UA: 6

## 2015-04-01 MED ORDER — AMOXICILLIN-POT CLAVULANATE 875-125 MG PO TABS: 1.0000 | ORAL_TABLET | Freq: Two times a day (BID) | ORAL | Status: DC

## 2015-04-01 NOTE — Progress Notes (Signed)
   Subjective:    Patient ID: Carolyn Stone, female    DOB: 1956-03-23, 58 y.o.   MRN: [id number]  Cough This is a new problem. The current episode started in the past 7 days. Associated symptoms include nasal congestion and rhinorrhea. Pertinent negatives include no chest pain, ear pain, fever, shortness of breath or wheezing. Associated symptoms comments: Sinus sx.    patient with significant head congestion drainage coughing present over the past week and half now with soreness in her sinuses. Denies any other particular underlying troubles. Relates some dysuria denies high fever chills   Review of Systems  Constitutional: Negative for fever and activity change.  HENT: Positive for congestion and rhinorrhea. Negative for ear pain.   Eyes: Negative for discharge.  Respiratory: Positive for cough. Negative for shortness of breath and wheezing.   Cardiovascular: Negative for chest pain.    Patient also c/o frequent urination     Objective:   Physical Exam  Constitutional: She appears well-developed.  HENT:  Head: Normocephalic.  Nose: Nose normal.  Mouth/Throat: Oropharynx is clear and moist. No oropharyngeal exudate.  Neck: Neck supple.  Cardiovascular: Normal rate and normal heart sounds.   No murmur heard. Pulmonary/Chest: Effort normal and breath sounds normal. She has no wheezes.  Lymphadenopathy:    She has no cervical adenopathy.  Skin: Skin is warm and dry.  Nursing note and vitals reviewed.     UA without WBCs     Assessment & Plan:   viral illness Secondary ethmoid sinusitis Antibiotics prescribed Follow-up if progressive troubles  Dysuria but no sign of UTI

## 2015-04-11 ENCOUNTER — Telehealth: Payer: Self-pay | Admitting: Family Medicine

## 2015-04-11 ENCOUNTER — Other Ambulatory Visit: Payer: Self-pay | Admitting: *Deleted

## 2015-04-11 ENCOUNTER — Other Ambulatory Visit: Payer: Self-pay | Admitting: Family Medicine

## 2015-04-11 MED ORDER — FLUCONAZOLE 150 MG PO TABS: ORAL_TABLET | ORAL | Status: DC

## 2015-04-11 NOTE — Telephone Encounter (Signed)
Nah 300 is pretty much max, can return to prot if pt desires, refill prn

## 2015-04-11 NOTE — Telephone Encounter (Signed)
Pt states Carolyn Stone stopped protonix and started zantac. Pt states protonix was working well but since being on zantac she wakes up sometimes in the middle of the night with reflux. Wants to know if she can take twice daily.

## 2015-04-11 NOTE — Telephone Encounter (Signed)
Having vaginal itching and some itching. Diflucan 150mg  #2 one po 3 days apart sent to pharm pt ntoified.

## 2015-04-11 NOTE — Telephone Encounter (Signed)
Pt is needing diflucan called in for a yeast infection. Pt was recently on antibiotics. Pt also wants to know if her antacid medicine can be increased to two a day.   [facility] [place]

## 2015-04-12 NOTE — Telephone Encounter (Signed)
Pt states she will try the zantac for a little bit longer and if she decides to switch to protonix she will call us back.

## 2015-04-20 ENCOUNTER — Ambulatory Visit (INDEPENDENT_AMBULATORY_CARE_PROVIDER_SITE_OTHER): Payer: 59 | Admitting: Family Medicine

## 2015-04-20 ENCOUNTER — Encounter: Payer: Self-pay | Admitting: Family Medicine

## 2015-04-20 VITALS — BP 148/90 | Ht 62.0 in | Wt 190.0 lb

## 2015-04-20 DIAGNOSIS — I10 Essential (primary) hypertension: Secondary | ICD-10-CM

## 2015-04-20 MED ORDER — ENALAPRIL MALEATE 5 MG PO TABS: 5.0000 mg | ORAL_TABLET | Freq: Every day | ORAL | Status: DC

## 2015-04-20 NOTE — Patient Instructions (Signed)
Consider getting a lifesource bp cuff/monitor and monitor one or two bp's per wk and record

## 2015-04-20 NOTE — Progress Notes (Signed)
   Subjective:    Patient ID: BROOK ZEINER, female    DOB: 01-16-1956, 59 y.o.   MRN: [id number]  HPIpt was at chiropractor yesterday and bp was elevated. She was told that she needed to see her doctor. Having headache, face feels flushed and ears ringing.   Bottom number 115, top number 200 , elevated at the chiroipractors office  Flushed sens and heacache since yesterday  reflux See prior notes patient now down to ranitidine. More reflux symptoms but would like to stay there as far as medicines  Strong family history of high blood pressure including siblings and parents next  Does not smoke exercises regularly mostly watch his salt intake  Results for orders placed or performed in visit on 04/01/15  POCT urinalysis dipstick  Result Value Ref Range   Color, UA     Clarity, UA     Glucose, UA     Bilirubin, UA     Ketones, UA     Spec Grav, UA 1.015    Blood, UA     pH, UA 6.0    Protein, UA     Urobilinogen, UA     Nitrite, UA     Leukocytes, UA  Negative    Flu vaccine today.   Review of Systems    no current chest pain no shortness of breath no abdominal discomfort ROS otherwise negative Objective:   Physical Exam  Alert vital stable blood pressure 142/92 on repeat bilateral lungs clear heart regular rhythm H&T normal      Assessment & Plan:  Impression 1 hypertension discussed long discussion held numbers not a level which are dangerous patient very symptomatic with this. Many questions answered plan 25 minutes spent most in discussion we'll go ahead and initiate enalapril side effects benefits discussed 5 mg daily at bedtime follow-up in 8 weeks monitor own blood pressure diet exercise discussed WSL

## 2015-06-14 ENCOUNTER — Other Ambulatory Visit: Payer: Self-pay | Admitting: Family Medicine

## 2015-06-15 ENCOUNTER — Ambulatory Visit (INDEPENDENT_AMBULATORY_CARE_PROVIDER_SITE_OTHER): Payer: 59 | Admitting: Family Medicine

## 2015-06-15 ENCOUNTER — Encounter: Payer: Self-pay | Admitting: Family Medicine

## 2015-06-15 VITALS — BP 124/90 | Ht 62.0 in | Wt 187.0 lb

## 2015-06-15 DIAGNOSIS — M1990 Unspecified osteoarthritis, unspecified site: Secondary | ICD-10-CM | POA: Diagnosis not present

## 2015-06-15 DIAGNOSIS — I10 Essential (primary) hypertension: Secondary | ICD-10-CM | POA: Diagnosis not present

## 2015-06-15 DIAGNOSIS — R7301 Impaired fasting glucose: Secondary | ICD-10-CM

## 2015-06-15 DIAGNOSIS — E785 Hyperlipidemia, unspecified: Secondary | ICD-10-CM | POA: Diagnosis not present

## 2015-06-15 DIAGNOSIS — Z23 Encounter for immunization: Secondary | ICD-10-CM

## 2015-06-15 DIAGNOSIS — Z79899 Other long term (current) drug therapy: Secondary | ICD-10-CM

## 2015-06-15 MED ORDER — AMOXICILLIN-POT CLAVULANATE 875-125 MG PO TABS: ORAL_TABLET | ORAL | Status: DC

## 2015-06-15 MED ORDER — RANITIDINE HCL 300 MG PO TABS: 300.0000 mg | ORAL_TABLET | Freq: Every day | ORAL | Status: DC

## 2015-06-15 MED ORDER — ENALAPRIL MALEATE 5 MG PO TABS: 5.0000 mg | ORAL_TABLET | Freq: Every day | ORAL | Status: DC

## 2015-06-15 NOTE — Progress Notes (Signed)
   Subjective:    Patient ID: Carolyn Stone, female    DOB: [birth year], 60 y.o.   MRN: [id number] Patient arrives office for follow-up of several distinct chronic concerns plus new ones. Hypertension This is a chronic problem. The current episode started more than 1 year ago. There are no compliance problems.    Patient in today for 8 week follow up.   Patient also has c/o of nasal congestion.  Handling the b p med well, no obv s e,  Put on some weight, upper resp mess  Kicked in last Monday  Nasal dcong, ear dismofort  Low gr fever  Some discomfort intermittently  Saline, quest sec to cerv disc pain also   Patient has chronic joint pain worse in the hands elbows hips. Has been worked up in the past. Seen in rheumatologist in the past. Notes more swelling in her hand joints. Concerned about the possibility rheumatoid arthritis.  Compliant with metformin. No obvious side effects. Watching sugar intake. Reports [place] well. Needs refills.      Review of Systems No headache no chest pain no back pain no abdominal pain no change in bowel habits ROS otherwise negative    Objective:   Physical Exam  Alert vital stable obesity present. Blood pressure good on repeat HEENT positive nasal congestion right submandibular glanular tenderness otherwise normal lungs clear heart rare rhythm hands some swelling at the metacarpophalangeal joints with question Heberden's nodes grip intact sensation pulses good      Assessment & Plan:  Impression hypertension good control meds reviewed to maintain same #2 rhinosinusitis discussed will cover #3 impaired fasting glucose no recent A1c. Patient wishes to maintain metformin No. 4 arthritis progressive of concerns regarding rheumatoid arthritis discuss plan appropriate blood work. Diet exercise discussed. Antibiotics prescribed. Other meds refilled. Recheck in 6 months further recommendations based results WSL

## 2015-07-18 IMAGING — CT CT ABD-PELV W/ CM
2 of 5 series · 17 of 46 positions shown, 19 images · IV contrast (Omnipaque 300)
Comparison: 12/04/2006

CLINICAL DATA: Left lower quadrant abdominal pain.

EXAM:
CT ABDOMEN AND PELVIS WITH CONTRAST
TECHNIQUE: Multidetector CT imaging of the abdomen and pelvis was performed
using the standard protocol following bolus administration of
intravenous contrast.
CONTRAST:  100mL OMNIPAQUE IOHEXOL 300 MG/ML  SOLN

[Series 2: abd_pel_with 5.0 b40f · axial · 0.75mm/px · z∈[-417,-32]mm · 14 of 89 slices shown, 16 images]
[im 6/89  soft-tissue]
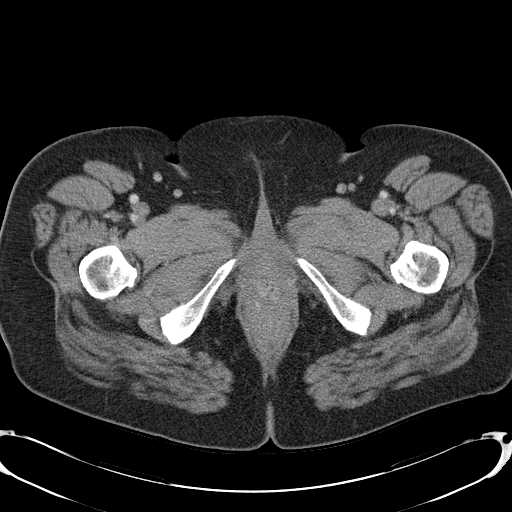
[im 6/89  bone]
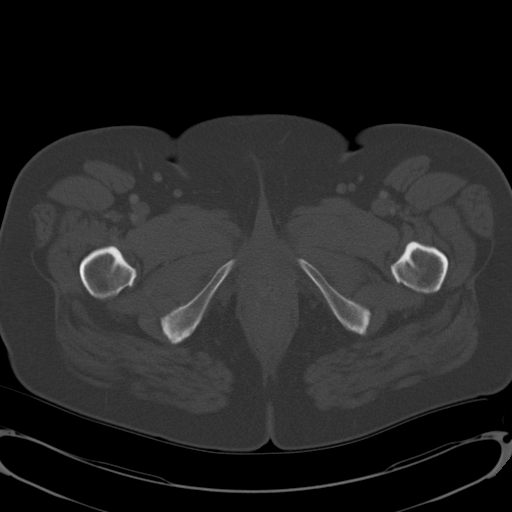
[im 11/89  soft-tissue]
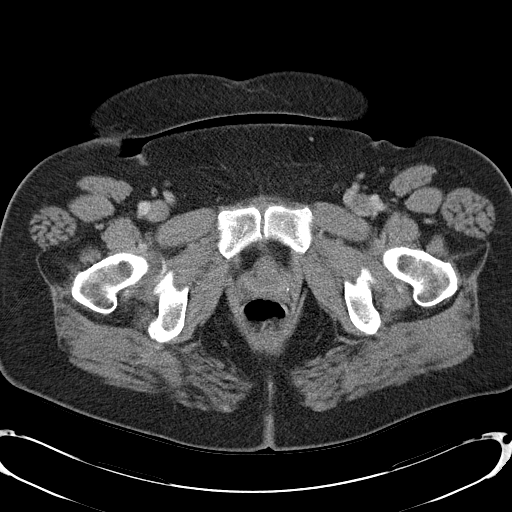
[im 16/89  soft-tissue]
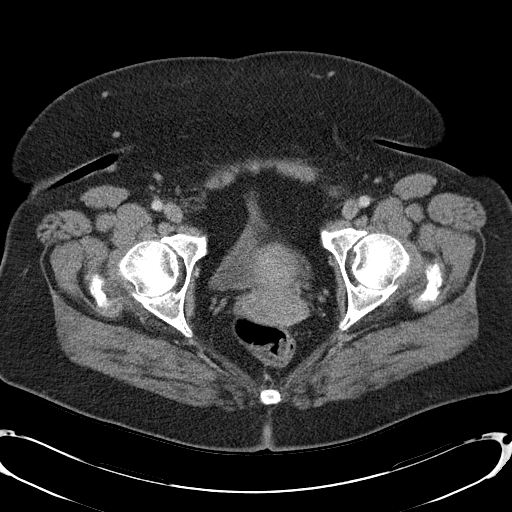
[im 26/89  soft-tissue]
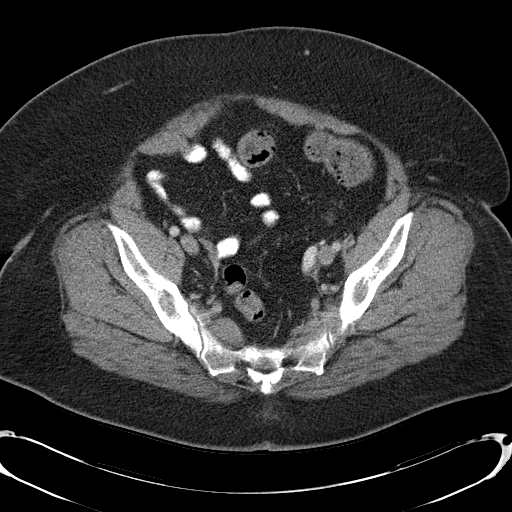
[im 32/89  soft-tissue]
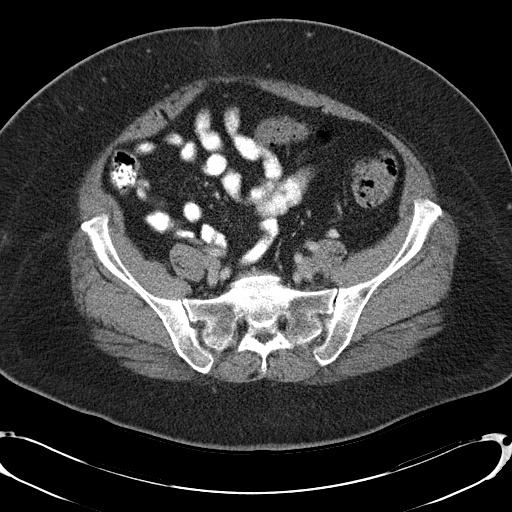
[im 37/89  soft-tissue]
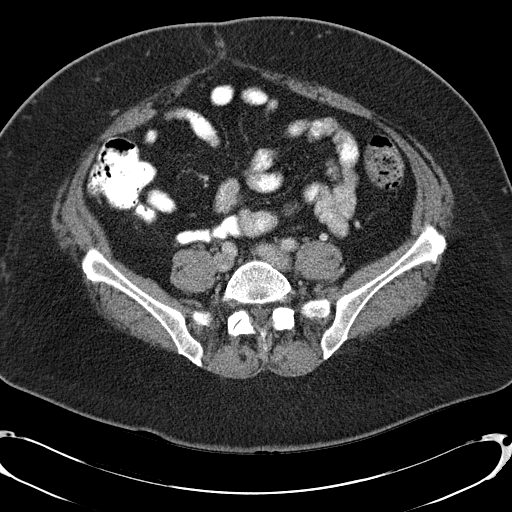
[im 42/89  soft-tissue]
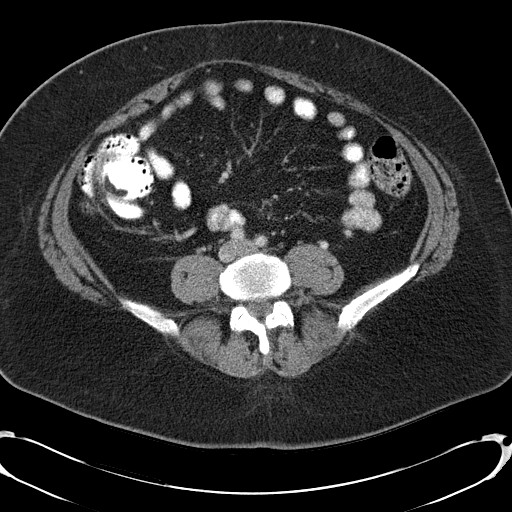
[im 47/89  soft-tissue]
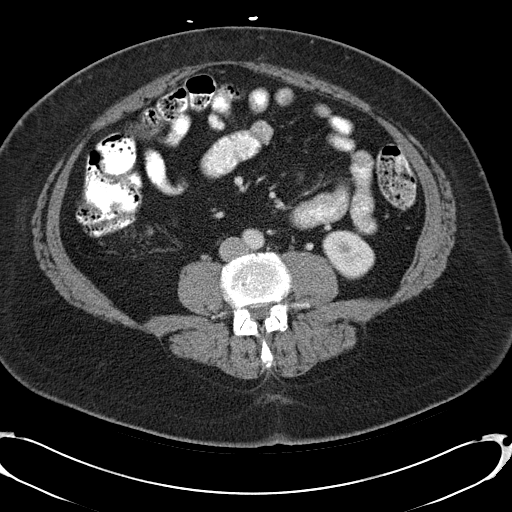
[im 52/89  soft-tissue]
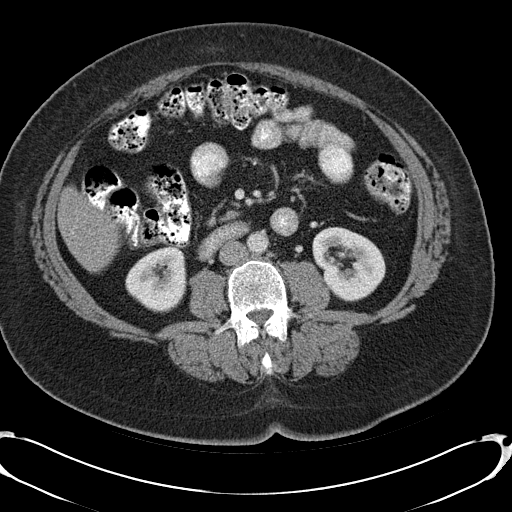
[im 52/89  bone]
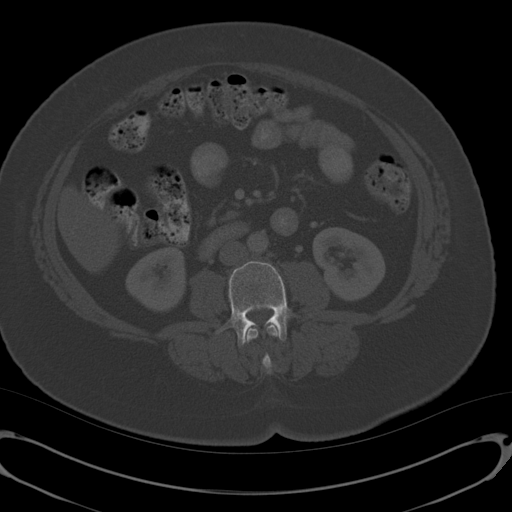
[im 57/89  soft-tissue]
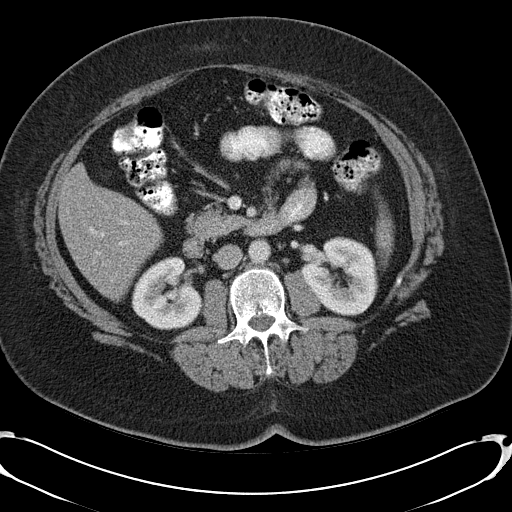
[im 68/89  soft-tissue]
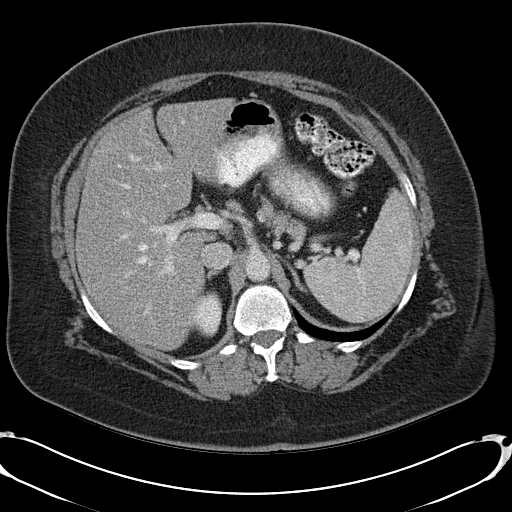
[im 73/89  soft-tissue]
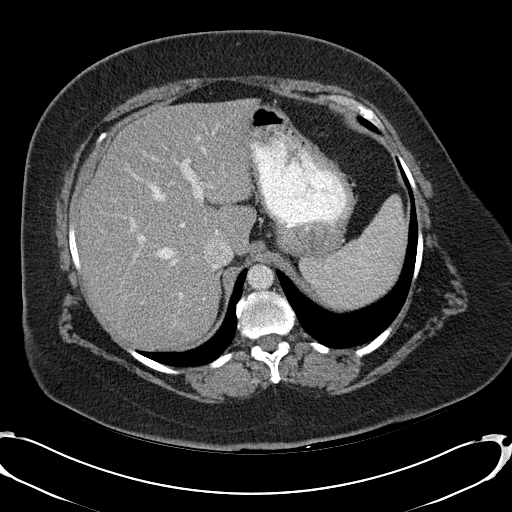
[im 78/89  soft-tissue]
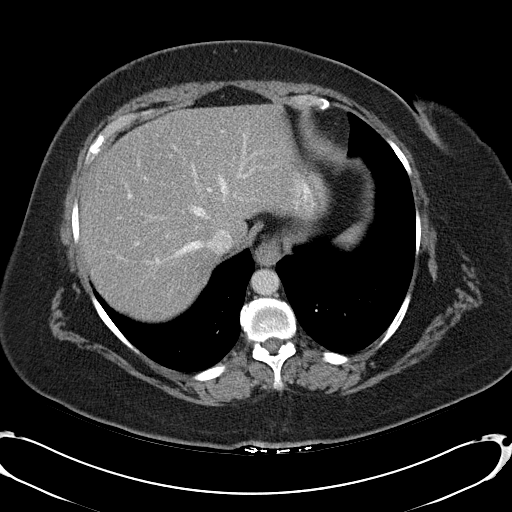
[im 83/89  soft-tissue]
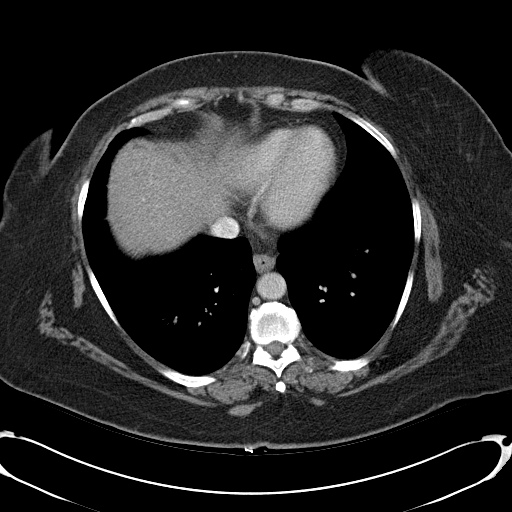

[Series 5: abd_pel_with 3.0 spo · coronal · 0.72mm/px · 3 of 100 slices shown]
[im 34/100  soft-tissue]
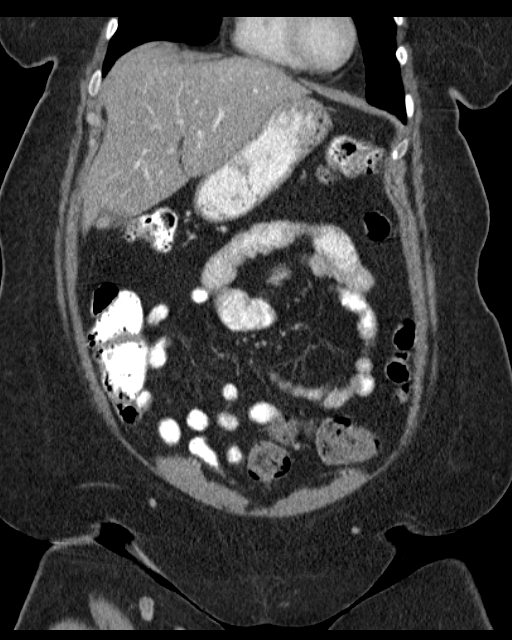
[im 45/100  soft-tissue]
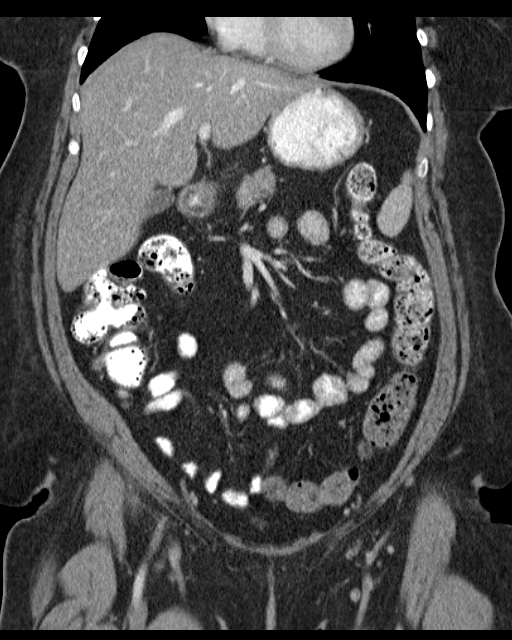
[im 56/100  soft-tissue]
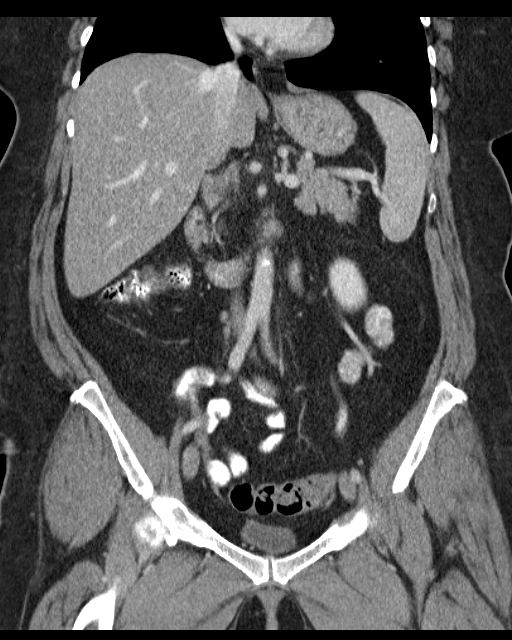

[17 of 46 positions shown; findings below may reference images not displayed]

FINDINGS: Small nodule in the left lower lobe on image 10 measures 4 mm and is
stable since 1664 compatible with benign nodule. No effusions. Lung
bases otherwise clear. Heart is normal size.

Mild diffuse fatty infiltration of the liver, stable. No focal
abnormality. Gallbladder, spleen, pancreas, adrenals and kidneys are
normal.

Uterus, adnexae and urinary bladder are unremarkable. Stomach, large
and small bowel grossly unremarkable. No evidence of diverticulosis
or diverticulitis. Moderate stool burden throughout the colon.

Aorta is normal caliber.  No free fluid, free air or adenopathy.

No acute bony abnormality or focal bone lesion.
IMPRESSION: Stable mild diffuse fatty infiltration of the liver.

No acute findings in the abdomen or pelvis.

Moderate stool burden.

## 2015-07-27 ENCOUNTER — Ambulatory Visit: Payer: 59 | Admitting: Nurse Practitioner

## 2015-07-29 ENCOUNTER — Telehealth: Payer: Self-pay | Admitting: Nurse Practitioner

## 2015-07-29 NOTE — Telephone Encounter (Signed)
Do you see this diagnosis? Also, HPV would come from her GYN not [place]. Does she mean HSV (herpes)? Also, HPV usually resolves. Many women have it at one point but it goes away; happens all the time.

## 2015-07-29 NOTE — Telephone Encounter (Signed)
Patient advise there is no dx in our chart- states all this was thru another office but she wanted to make sure it was not noted in our chart

## 2015-07-29 NOTE — Telephone Encounter (Signed)
I cannot find it in our EMR. If her GYN is in another system it probably will not be in [place].

## 2015-07-29 NOTE — Telephone Encounter (Signed)
Notified patient we cannot find it in our EMR. If her GYN is in another system it probably will not be in [place]. Patient verbalized understanding.

## 2015-07-29 NOTE — Telephone Encounter (Signed)
Pt called stating that she would like some information changed in her chart. Pt was diagnosed with HPV with [place] and a different Dr stated that she did not have it. Pt would like to [place] to see about getting this removed.

## 2015-12-12 ENCOUNTER — Ambulatory Visit: Payer: 59 | Admitting: Family Medicine

## 2015-12-28 ENCOUNTER — Encounter: Payer: Self-pay | Admitting: Podiatry

## 2015-12-28 ENCOUNTER — Ambulatory Visit (INDEPENDENT_AMBULATORY_CARE_PROVIDER_SITE_OTHER): Payer: 59 | Admitting: Podiatry

## 2015-12-28 DIAGNOSIS — G629 Polyneuropathy, unspecified: Secondary | ICD-10-CM

## 2015-12-29 NOTE — Progress Notes (Signed)
She presents today for primary concern of numbness and she has retained since surgery the lateral aspect of the left foot from a peroneal tendon repair one and a half years ago. She is questioning whether physical therapy would help alleviate the numbness. She states that she doesn't have any pain and she is able to workout on a regular basis. She denies changes in her past medical history medications and allergies.  Objective: Vital signs are stable alert and oriented 3. Pulses are palpable. Neurologic sensorium is intact with exception of the lateral aspect of the foot where she does feel some loss of sensation along the sural distribution. Muscle strength is normal no edema no erythema no open lesions or wounds.  Assessment: Numbness associated with surgery lateral aspect of the left foot  Plan: We discussed whether or not physical therapy would be of benefit at this point a year and a half out I do not feel that physical therapy would be of benefit. I conveyed this to her and she is fine with this. Follow up with me as needed.

## 2016-03-03 ENCOUNTER — Other Ambulatory Visit: Payer: Self-pay | Admitting: Family Medicine

## 2016-03-07 ENCOUNTER — Other Ambulatory Visit: Payer: Self-pay

## 2016-03-07 ENCOUNTER — Telehealth: Payer: Self-pay | Admitting: Family Medicine

## 2016-03-07 DIAGNOSIS — Z1211 Encounter for screening for malignant neoplasm of colon: Secondary | ICD-10-CM

## 2016-03-07 NOTE — Telephone Encounter (Signed)
Pt left msg on my voicemail requesting referral to Dr. Laural Golden for colonoscopy   Please initiate in system so that I may process

## 2016-03-07 NOTE — Telephone Encounter (Signed)
May have referral to [place] for colonoscopy

## 2016-03-07 NOTE — Telephone Encounter (Signed)
Referral in system

## 2016-03-08 ENCOUNTER — Encounter: Payer: Self-pay | Admitting: Family Medicine

## 2016-03-08 ENCOUNTER — Encounter (INDEPENDENT_AMBULATORY_CARE_PROVIDER_SITE_OTHER): Payer: Self-pay | Admitting: *Deleted

## 2016-03-16 ENCOUNTER — Other Ambulatory Visit: Payer: Self-pay | Admitting: Family Medicine

## 2016-03-22 ENCOUNTER — Other Ambulatory Visit: Payer: Self-pay | Admitting: Family Medicine

## 2016-03-22 ENCOUNTER — Other Ambulatory Visit: Payer: Self-pay | Admitting: *Deleted

## 2016-03-22 MED ORDER — ENALAPRIL MALEATE 5 MG PO TABS
5.0000 mg | ORAL_TABLET | Freq: Every day | ORAL | 0 refills | Status: DC
Start: 2016-03-22 — End: 2016-05-01

## 2016-05-01 ENCOUNTER — Other Ambulatory Visit: Payer: Self-pay | Admitting: Family Medicine

## 2016-05-05 ENCOUNTER — Other Ambulatory Visit: Payer: Self-pay | Admitting: Family Medicine

## 2016-05-18 ENCOUNTER — Other Ambulatory Visit: Payer: Self-pay | Admitting: Family Medicine

## 2016-06-01 DIAGNOSIS — J019 Acute sinusitis, unspecified: Secondary | ICD-10-CM | POA: Diagnosis not present

## 2016-06-01 DIAGNOSIS — H1032 Unspecified acute conjunctivitis, left eye: Secondary | ICD-10-CM | POA: Diagnosis not present

## 2016-06-11 ENCOUNTER — Other Ambulatory Visit: Payer: Self-pay | Admitting: Family Medicine

## 2016-06-11 DIAGNOSIS — M47812 Spondylosis without myelopathy or radiculopathy, cervical region: Secondary | ICD-10-CM | POA: Diagnosis not present

## 2016-06-11 DIAGNOSIS — M4004 Postural kyphosis, thoracic region: Secondary | ICD-10-CM | POA: Diagnosis not present

## 2016-06-11 DIAGNOSIS — M531 Cervicobrachial syndrome: Secondary | ICD-10-CM | POA: Diagnosis not present

## 2016-06-15 DIAGNOSIS — M47812 Spondylosis without myelopathy or radiculopathy, cervical region: Secondary | ICD-10-CM | POA: Diagnosis not present

## 2016-06-15 DIAGNOSIS — M4004 Postural kyphosis, thoracic region: Secondary | ICD-10-CM | POA: Diagnosis not present

## 2016-06-15 DIAGNOSIS — M531 Cervicobrachial syndrome: Secondary | ICD-10-CM | POA: Diagnosis not present

## 2016-06-18 DIAGNOSIS — M531 Cervicobrachial syndrome: Secondary | ICD-10-CM | POA: Diagnosis not present

## 2016-06-18 DIAGNOSIS — M4004 Postural kyphosis, thoracic region: Secondary | ICD-10-CM | POA: Diagnosis not present

## 2016-06-18 DIAGNOSIS — M47812 Spondylosis without myelopathy or radiculopathy, cervical region: Secondary | ICD-10-CM | POA: Diagnosis not present

## 2016-06-20 DIAGNOSIS — Z1211 Encounter for screening for malignant neoplasm of colon: Secondary | ICD-10-CM | POA: Diagnosis not present

## 2016-06-20 DIAGNOSIS — Z1212 Encounter for screening for malignant neoplasm of rectum: Secondary | ICD-10-CM | POA: Diagnosis not present

## 2016-06-21 DIAGNOSIS — M531 Cervicobrachial syndrome: Secondary | ICD-10-CM | POA: Diagnosis not present

## 2016-06-21 DIAGNOSIS — M47812 Spondylosis without myelopathy or radiculopathy, cervical region: Secondary | ICD-10-CM | POA: Diagnosis not present

## 2016-06-21 DIAGNOSIS — M4004 Postural kyphosis, thoracic region: Secondary | ICD-10-CM | POA: Diagnosis not present

## 2016-06-25 DIAGNOSIS — M531 Cervicobrachial syndrome: Secondary | ICD-10-CM | POA: Diagnosis not present

## 2016-06-25 DIAGNOSIS — M4004 Postural kyphosis, thoracic region: Secondary | ICD-10-CM | POA: Diagnosis not present

## 2016-06-25 DIAGNOSIS — M47812 Spondylosis without myelopathy or radiculopathy, cervical region: Secondary | ICD-10-CM | POA: Diagnosis not present

## 2016-06-27 DIAGNOSIS — J029 Acute pharyngitis, unspecified: Secondary | ICD-10-CM | POA: Diagnosis not present

## 2016-07-02 DIAGNOSIS — M531 Cervicobrachial syndrome: Secondary | ICD-10-CM | POA: Diagnosis not present

## 2016-07-02 DIAGNOSIS — M4004 Postural kyphosis, thoracic region: Secondary | ICD-10-CM | POA: Diagnosis not present

## 2016-07-02 DIAGNOSIS — M47812 Spondylosis without myelopathy or radiculopathy, cervical region: Secondary | ICD-10-CM | POA: Diagnosis not present

## 2016-07-05 DIAGNOSIS — M4004 Postural kyphosis, thoracic region: Secondary | ICD-10-CM | POA: Diagnosis not present

## 2016-07-05 DIAGNOSIS — M531 Cervicobrachial syndrome: Secondary | ICD-10-CM | POA: Diagnosis not present

## 2016-07-05 DIAGNOSIS — M47812 Spondylosis without myelopathy or radiculopathy, cervical region: Secondary | ICD-10-CM | POA: Diagnosis not present

## 2016-07-16 DIAGNOSIS — M62838 Other muscle spasm: Secondary | ICD-10-CM | POA: Diagnosis not present

## 2016-07-17 ENCOUNTER — Other Ambulatory Visit: Payer: Self-pay | Admitting: Family Medicine

## 2016-07-31 ENCOUNTER — Telehealth: Payer: Self-pay | Admitting: Family Medicine

## 2016-07-31 ENCOUNTER — Ambulatory Visit: Payer: 59 | Admitting: Family Medicine

## 2016-07-31 NOTE — Telephone Encounter (Signed)
Pt.notified

## 2016-07-31 NOTE — Telephone Encounter (Signed)
Patient is requesting blood work to have done this week for her appointment on 08/09/16 with Dr. Richardson Landry.

## 2016-07-31 NOTE — Telephone Encounter (Signed)
Left message to return call 

## 2016-07-31 NOTE — Telephone Encounter (Signed)
ntsw pt needs to get b w orered in [place]

## 2016-08-02 ENCOUNTER — Telehealth: Payer: Self-pay | Admitting: Family Medicine

## 2016-08-02 DIAGNOSIS — R7301 Impaired fasting glucose: Secondary | ICD-10-CM | POA: Diagnosis not present

## 2016-08-02 DIAGNOSIS — I10 Essential (primary) hypertension: Secondary | ICD-10-CM

## 2016-08-02 DIAGNOSIS — M62838 Other muscle spasm: Secondary | ICD-10-CM | POA: Diagnosis not present

## 2016-08-02 DIAGNOSIS — E785 Hyperlipidemia, unspecified: Secondary | ICD-10-CM

## 2016-08-02 NOTE — Telephone Encounter (Signed)
Spoke with patient and informed her per Dr.Steve Luking- Labs have been ordered. Patient verbalized understanding.  

## 2016-08-02 NOTE — Telephone Encounter (Signed)
Pt is needing her labs to be reordered that are in the system, they are more than six months old. Pt states that she was notified by Abigail Butts that they were in the system and that she could go on over, but the labs have expired. Please advise.

## 2016-08-02 NOTE — Telephone Encounter (Signed)
Please see other message taken this morning

## 2016-08-02 NOTE — Telephone Encounter (Signed)
Patient went to [facility] today to have Lab drawn stating that she was told earlier this week that labs have been ordered. Reviewed patient's chart and the labs that were ordered were from January 2017 which are now expired because that was over 12 months ago. Would you like to order the same labs. Please advise?

## 2016-08-02 NOTE — Telephone Encounter (Signed)
Need to re order the prior blood work ordered

## 2016-08-03 LAB — LIPID PANEL
CHOLESTEROL TOTAL: 218 mg/dL — AB (ref 100–199)
Chol/HDL Ratio: 5.1 ratio units — ABNORMAL HIGH (ref 0.0–4.4)
HDL: 43 mg/dL (ref >39)
LDL Calculated: 142 mg/dL — ABNORMAL HIGH (ref 0–99)
TRIGLYCERIDES: 167 mg/dL — AB (ref 0–149)
VLDL CHOLESTEROL CAL: 33 mg/dL (ref 5–40)

## 2016-08-03 LAB — BASIC METABOLIC PANEL
BUN / CREAT RATIO: 14 (ref 12–28)
BUN: 11 mg/dL (ref 8–27)
CALCIUM: 9.5 mg/dL (ref 8.7–10.3)
CHLORIDE: 100 mmol/L (ref 96–106)
CO2: 26 mmol/L (ref 18–29)
Creatinine, Ser: 0.78 mg/dL (ref 0.57–1.00)
GFR calc Af Amer: 96 mL/min/{1.73_m2} (ref >59)
GFR, EST NON AFRICAN AMERICAN: 83 mL/min/{1.73_m2} (ref >59)
Glucose: 106 mg/dL — ABNORMAL HIGH (ref 65–99)
POTASSIUM: 4.7 mmol/L (ref 3.5–5.2)
SODIUM: 140 mmol/L (ref 134–144)

## 2016-08-03 LAB — HEPATIC FUNCTION PANEL
ALT: 32 IU/L (ref 0–32)
AST: 27 IU/L (ref 0–40)
Albumin: 4.4 g/dL (ref 3.6–4.8)
Alkaline Phosphatase: 30 IU/L — ABNORMAL LOW (ref 39–117)
Bilirubin Total: 0.8 mg/dL (ref 0.0–1.2)
Bilirubin, Direct: 0.2 mg/dL (ref 0.00–0.40)
Total Protein: 7.7 g/dL (ref 6.0–8.5)

## 2016-08-03 LAB — HEMOGLOBIN A1C
ESTIMATED AVERAGE GLUCOSE: 117 mg/dL
HEMOGLOBIN A1C: 5.7 % — AB (ref 4.8–5.6)

## 2016-08-08 DIAGNOSIS — Z0000 Encounter for general adult medical examination without abnormal findings: Secondary | ICD-10-CM | POA: Diagnosis not present

## 2016-08-08 DIAGNOSIS — R739 Hyperglycemia, unspecified: Secondary | ICD-10-CM | POA: Diagnosis not present

## 2016-08-08 DIAGNOSIS — Z780 Asymptomatic menopausal state: Secondary | ICD-10-CM | POA: Diagnosis not present

## 2016-08-08 DIAGNOSIS — E784 Other hyperlipidemia: Secondary | ICD-10-CM | POA: Diagnosis not present

## 2016-08-08 DIAGNOSIS — I10 Essential (primary) hypertension: Secondary | ICD-10-CM | POA: Diagnosis not present

## 2016-08-09 ENCOUNTER — Ambulatory Visit: Payer: 59 | Admitting: Family Medicine

## 2016-08-15 DIAGNOSIS — J019 Acute sinusitis, unspecified: Secondary | ICD-10-CM | POA: Diagnosis not present

## 2016-08-27 DIAGNOSIS — M62838 Other muscle spasm: Secondary | ICD-10-CM | POA: Diagnosis not present

## 2016-09-26 DIAGNOSIS — M62838 Other muscle spasm: Secondary | ICD-10-CM | POA: Diagnosis not present

## 2016-10-17 DIAGNOSIS — M79676 Pain in unspecified toe(s): Secondary | ICD-10-CM

## 2016-11-02 DIAGNOSIS — Z029 Encounter for administrative examinations, unspecified: Secondary | ICD-10-CM

## 2016-12-06 DIAGNOSIS — K219 Gastro-esophageal reflux disease without esophagitis: Secondary | ICD-10-CM | POA: Diagnosis not present

## 2016-12-06 DIAGNOSIS — R739 Hyperglycemia, unspecified: Secondary | ICD-10-CM | POA: Diagnosis not present

## 2016-12-06 DIAGNOSIS — I10 Essential (primary) hypertension: Secondary | ICD-10-CM | POA: Diagnosis not present

## 2016-12-17 DIAGNOSIS — Z0389 Encounter for observation for other suspected diseases and conditions ruled out: Secondary | ICD-10-CM | POA: Diagnosis not present

## 2016-12-20 ENCOUNTER — Encounter (INDEPENDENT_AMBULATORY_CARE_PROVIDER_SITE_OTHER): Payer: Self-pay | Admitting: Internal Medicine

## 2016-12-21 DIAGNOSIS — H52223 Regular astigmatism, bilateral: Secondary | ICD-10-CM | POA: Diagnosis not present

## 2016-12-21 DIAGNOSIS — H5203 Hypermetropia, bilateral: Secondary | ICD-10-CM | POA: Diagnosis not present

## 2016-12-21 DIAGNOSIS — H524 Presbyopia: Secondary | ICD-10-CM | POA: Diagnosis not present

## 2016-12-25 ENCOUNTER — Ambulatory Visit (INDEPENDENT_AMBULATORY_CARE_PROVIDER_SITE_OTHER): Payer: 59 | Admitting: Internal Medicine

## 2016-12-25 ENCOUNTER — Encounter (INDEPENDENT_AMBULATORY_CARE_PROVIDER_SITE_OTHER): Payer: Self-pay | Admitting: Internal Medicine

## 2016-12-25 ENCOUNTER — Encounter (INDEPENDENT_AMBULATORY_CARE_PROVIDER_SITE_OTHER): Payer: Self-pay | Admitting: *Deleted

## 2016-12-25 ENCOUNTER — Encounter (INDEPENDENT_AMBULATORY_CARE_PROVIDER_SITE_OTHER): Payer: Self-pay

## 2016-12-25 VITALS — BP 150/90 | HR 64 | Temp 97.6°F | Ht 62.0 in | Wt 194.1 lb

## 2016-12-25 DIAGNOSIS — R1310 Dysphagia, unspecified: Secondary | ICD-10-CM

## 2016-12-25 DIAGNOSIS — E119 Type 2 diabetes mellitus without complications: Secondary | ICD-10-CM | POA: Insufficient documentation

## 2016-12-25 DIAGNOSIS — R1319 Other dysphagia: Secondary | ICD-10-CM

## 2016-12-25 HISTORY — DX: Dysphagia, unspecified: R13.10

## 2016-12-25 NOTE — Patient Instructions (Signed)
DG esophagram.   

## 2016-12-25 NOTE — Progress Notes (Signed)
   Subjective:    Patient ID: Carolyn Stone, female    DOB: Jul 16, 1955, 61 y.o.   MRN: [id number] PCP Dr. Baltazar Apo.  HPI  Referred for GERD. By Dr. Anastasio Champion.  Saw Dr. Lavonne Chick 12/06/2016 with fever and diarrhea. . Also GERD. Some discomfort on swallowing.    Covered with Zantac but really not helping. Had taken Protonix in the past which helped. Dr. Anastasio Champion covered her with Protonix which has helped. She is watching her diet. She tries to avoid spicy foods.  If foods are tough, sometimes they are going down slow. Noticeable worse in the last 3 months.  Appetite is good. No weight loss. Has a BM x 2 a day. No melena or BRRB.   Hx of diabetes ? years Review of Systems Past Medical History:  Diagnosis Date  . Allergy   . Anxiety   . Arthritis   . Myalgia and myositis    "undetermined inflammatory process"    Past Surgical History:  Procedure Laterality Date  . CARPAL TUNNEL RELEASE Bilateral   . CESAREAN SECTION    . DILATION AND CURETTAGE OF UTERUS     x2  . FOOT SURGERY Left     Allergies  Allergen Reactions  . Bee Venom   . Nitrofurantoin     REACTION: weakness, vomiting, fainting    Current Outpatient Prescriptions on File Prior to Visit  Medication Sig Dispense Refill  . diclofenac sodium (VOLTAREN) 1 % GEL Apply 2 g topically 4 (four) times daily. Prn pain 100 g 0  . enalapril (VASOTEC) 5 MG tablet TAKE ONE TABLET BY MOUTH ONCE DAILY (Patient taking differently: twice a day) 30 tablet 0  . metFORMIN (GLUCOPHAGE) 500 MG tablet TAKE ONE TABLET BY MOUTH TWICE DAILY WITH MEALS *NEED OFFICE VISIT* (Patient taking differently: TAKE ONE TABLET BY MOUTH daily) 15 tablet 0   No current facility-administered medications on file prior to visit.         Objective:   Physical Exam Blood pressure (!) 150/90, pulse 64, temperature 97.6 F (36.4 C), height 5\' 2"  (1.575 m), weight 194 lb 1.6 oz (88 kg).  Alert and oriented. Skin warm and dry. Oral mucosa is moist.   .  Sclera anicteric, conjunctivae is pink. Thyroid not enlarged. No cervical lymphadenopathy. Lungs clear. Heart regular rate and rhythm.  Abdomen is soft. Bowel sounds are positive. No hepatomegaly. No abdominal masses felt. No tenderness.  No edema to lower extremities. Patient is alert and oriented.        Assessment & Plan:  GERD controlled with Protonix. Dysphagia. Chew foods well. Will get a DG Esophagram

## 2017-01-03 ENCOUNTER — Ambulatory Visit (HOSPITAL_COMMUNITY)
Admission: RE | Admit: 2017-01-03 | Discharge: 2017-01-03 | Disposition: A | Discharge status: Home / Self Care | Payer: 59 | Source: Ambulatory Visit | Attending: Internal Medicine | Admitting: Internal Medicine

## 2017-01-03 DIAGNOSIS — K224 Dyskinesia of esophagus: Secondary | ICD-10-CM | POA: Insufficient documentation

## 2017-01-03 DIAGNOSIS — R1319 Other dysphagia: Secondary | ICD-10-CM

## 2017-01-03 DIAGNOSIS — K222 Esophageal obstruction: Secondary | ICD-10-CM | POA: Insufficient documentation

## 2017-01-03 DIAGNOSIS — R1310 Dysphagia, unspecified: Secondary | ICD-10-CM | POA: Diagnosis not present

## 2017-01-03 DIAGNOSIS — K449 Diaphragmatic hernia without obstruction or gangrene: Secondary | ICD-10-CM | POA: Insufficient documentation

## 2017-01-17 DIAGNOSIS — E559 Vitamin D deficiency, unspecified: Secondary | ICD-10-CM | POA: Diagnosis not present

## 2017-01-17 DIAGNOSIS — E785 Hyperlipidemia, unspecified: Secondary | ICD-10-CM | POA: Diagnosis not present

## 2017-01-17 DIAGNOSIS — I10 Essential (primary) hypertension: Secondary | ICD-10-CM | POA: Diagnosis not present

## 2017-01-18 ENCOUNTER — Encounter (INDEPENDENT_AMBULATORY_CARE_PROVIDER_SITE_OTHER): Payer: Self-pay | Admitting: Internal Medicine

## 2017-01-18 NOTE — Progress Notes (Signed)
Appointment was given for 02/19/17 at 10:15am w/Terri Vaughan [place], NP.  A letter was mailed to the patient.

## 2017-02-18 DIAGNOSIS — Z780 Asymptomatic menopausal state: Secondary | ICD-10-CM | POA: Diagnosis not present

## 2017-02-18 DIAGNOSIS — I10 Essential (primary) hypertension: Secondary | ICD-10-CM | POA: Diagnosis not present

## 2017-02-19 ENCOUNTER — Ambulatory Visit (INDEPENDENT_AMBULATORY_CARE_PROVIDER_SITE_OTHER): Payer: 59 | Admitting: Internal Medicine

## 2017-03-12 DIAGNOSIS — Z029 Encounter for administrative examinations, unspecified: Secondary | ICD-10-CM

## 2017-03-15 DIAGNOSIS — Z23 Encounter for immunization: Secondary | ICD-10-CM | POA: Diagnosis not present

## 2017-03-20 DIAGNOSIS — J209 Acute bronchitis, unspecified: Secondary | ICD-10-CM | POA: Diagnosis not present

## 2017-03-20 DIAGNOSIS — Z780 Asymptomatic menopausal state: Secondary | ICD-10-CM | POA: Diagnosis not present

## 2017-05-02 DIAGNOSIS — N390 Urinary tract infection, site not specified: Secondary | ICD-10-CM | POA: Diagnosis not present

## 2017-05-02 DIAGNOSIS — I10 Essential (primary) hypertension: Secondary | ICD-10-CM | POA: Diagnosis not present

## 2017-05-02 DIAGNOSIS — Z780 Asymptomatic menopausal state: Secondary | ICD-10-CM | POA: Diagnosis not present

## 2017-07-02 DIAGNOSIS — Z01419 Encounter for gynecological examination (general) (routine) without abnormal findings: Secondary | ICD-10-CM | POA: Diagnosis not present

## 2017-07-11 DIAGNOSIS — J029 Acute pharyngitis, unspecified: Secondary | ICD-10-CM | POA: Diagnosis not present

## 2017-07-30 DIAGNOSIS — M545 Low back pain: Secondary | ICD-10-CM | POA: Diagnosis not present

## 2017-08-06 DIAGNOSIS — M7061 Trochanteric bursitis, right hip: Secondary | ICD-10-CM | POA: Diagnosis not present

## 2017-08-06 DIAGNOSIS — M25551 Pain in right hip: Secondary | ICD-10-CM | POA: Insufficient documentation

## 2017-08-06 DIAGNOSIS — M1611 Unilateral primary osteoarthritis, right hip: Secondary | ICD-10-CM | POA: Diagnosis not present

## 2017-08-12 DIAGNOSIS — E7849 Other hyperlipidemia: Secondary | ICD-10-CM | POA: Diagnosis not present

## 2017-08-12 DIAGNOSIS — Z0000 Encounter for general adult medical examination without abnormal findings: Secondary | ICD-10-CM | POA: Diagnosis not present

## 2017-08-12 DIAGNOSIS — I10 Essential (primary) hypertension: Secondary | ICD-10-CM | POA: Diagnosis not present

## 2017-08-12 DIAGNOSIS — R739 Hyperglycemia, unspecified: Secondary | ICD-10-CM | POA: Diagnosis not present

## 2017-08-12 DIAGNOSIS — R5383 Other fatigue: Secondary | ICD-10-CM | POA: Diagnosis not present

## 2017-09-02 DIAGNOSIS — R5383 Other fatigue: Secondary | ICD-10-CM | POA: Diagnosis not present

## 2017-09-02 DIAGNOSIS — Z780 Asymptomatic menopausal state: Secondary | ICD-10-CM | POA: Diagnosis not present

## 2017-10-15 ENCOUNTER — Other Ambulatory Visit: Payer: Self-pay | Admitting: Podiatry

## 2017-10-15 ENCOUNTER — Ambulatory Visit (INDEPENDENT_AMBULATORY_CARE_PROVIDER_SITE_OTHER): Payer: 59

## 2017-10-15 ENCOUNTER — Ambulatory Visit: Payer: 59 | Admitting: Podiatry

## 2017-10-15 ENCOUNTER — Encounter: Payer: Self-pay | Admitting: Podiatry

## 2017-10-15 DIAGNOSIS — M7751 Other enthesopathy of right foot: Secondary | ICD-10-CM | POA: Diagnosis not present

## 2017-10-15 DIAGNOSIS — M7752 Other enthesopathy of left foot: Secondary | ICD-10-CM

## 2017-10-15 DIAGNOSIS — M779 Enthesopathy, unspecified: Secondary | ICD-10-CM

## 2017-10-15 DIAGNOSIS — M778 Other enthesopathies, not elsewhere classified: Secondary | ICD-10-CM

## 2017-10-15 DIAGNOSIS — M21619 Bunion of unspecified foot: Secondary | ICD-10-CM

## 2017-10-15 DIAGNOSIS — Q828 Other specified congenital malformations of skin: Secondary | ICD-10-CM | POA: Diagnosis not present

## 2017-10-16 NOTE — Progress Notes (Signed)
She presents today after having not seen her for a while with a chief complaint of pain to her left ankle and her right fifth metatarsal phalangeal joint area.  States they both been hurting for quite some time now she feels like there is something sticking in her ankle joint as she refers to it.  She is also complaining of possible shoe gear damage to the fifth metatarsal head of the right foot.  Objective: Vital signs are stable alert and oriented x3.  Pulses are palpable.  Neurologic sensorium is intact deep tendon reflexes are intact muscle strength is normal symmetrical bilateral.  Orthopedic evaluation demonstrates pain on palpation of fifth metatarsal phalangeal joint no pain on end range of motion of the fifth metatarsal phalangeal joint.  Majority of her pain is associated with the lesion that is beneath the joint.  The soft tissue lesion appears to be a porokeratotic lesion with possible bursitis associated with that there is no open lesions or wounds.  Left foot does demonstrate pain on end range of motion of the subtalar joint ankle joint has full range of motion without any fluid and no pain.  She also has pain on palpation of the sinus tarsi.  Radiographs taken today demonstrate internal fixation fifth metatarsal otherwise no significant acute findings.  Assessment: Subtalar joint capsulitis left foot.  Porokeratotic lesion with bursitis capsulitis fifth metatarsal phalangeal joint right foot.  Plan: Discussed etiology pathology conservative versus surgical therapies at this point after sterile Betadine skin prep I injected 20 mg Kenalog 5 mg Marcaine to the sinus tarsi and subtalar joint area of the left foot.  Tolerated procedure well without complications.  Attention was then directed to the right foot where 2 mg of dexamethasone and local anesthetic was injected sub-lesionaly and I debrided the reactive hyperkeratotic lesion fully.  We discussed appropriate shoe gear and I will follow-up with  her on an as-needed basis.

## 2017-10-23 DIAGNOSIS — R5383 Other fatigue: Secondary | ICD-10-CM | POA: Diagnosis not present

## 2017-10-24 DIAGNOSIS — R768 Other specified abnormal immunological findings in serum: Secondary | ICD-10-CM | POA: Insufficient documentation

## 2017-10-24 DIAGNOSIS — M2550 Pain in unspecified joint: Secondary | ICD-10-CM | POA: Diagnosis not present

## 2017-10-24 DIAGNOSIS — M50322 Other cervical disc degeneration at C5-C6 level: Secondary | ICD-10-CM | POA: Diagnosis not present

## 2017-10-24 DIAGNOSIS — M79641 Pain in right hand: Secondary | ICD-10-CM | POA: Diagnosis not present

## 2017-10-24 DIAGNOSIS — M25551 Pain in right hip: Secondary | ICD-10-CM | POA: Diagnosis not present

## 2017-10-24 DIAGNOSIS — D229 Melanocytic nevi, unspecified: Secondary | ICD-10-CM | POA: Insufficient documentation

## 2017-11-19 ENCOUNTER — Ambulatory Visit: Payer: 59 | Admitting: Podiatry

## 2017-11-21 ENCOUNTER — Ambulatory Visit: Payer: 59 | Admitting: Podiatry

## 2017-12-05 DIAGNOSIS — M359 Systemic involvement of connective tissue, unspecified: Secondary | ICD-10-CM | POA: Insufficient documentation

## 2017-12-06 DIAGNOSIS — M2550 Pain in unspecified joint: Secondary | ICD-10-CM | POA: Diagnosis not present

## 2017-12-06 DIAGNOSIS — R768 Other specified abnormal immunological findings in serum: Secondary | ICD-10-CM | POA: Diagnosis not present

## 2017-12-06 DIAGNOSIS — M359 Systemic involvement of connective tissue, unspecified: Secondary | ICD-10-CM | POA: Diagnosis not present

## 2017-12-23 DIAGNOSIS — E7849 Other hyperlipidemia: Secondary | ICD-10-CM | POA: Diagnosis not present

## 2017-12-23 DIAGNOSIS — I10 Essential (primary) hypertension: Secondary | ICD-10-CM | POA: Diagnosis not present

## 2017-12-24 DIAGNOSIS — H5203 Hypermetropia, bilateral: Secondary | ICD-10-CM | POA: Diagnosis not present

## 2017-12-24 DIAGNOSIS — H52223 Regular astigmatism, bilateral: Secondary | ICD-10-CM | POA: Diagnosis not present

## 2017-12-24 DIAGNOSIS — H524 Presbyopia: Secondary | ICD-10-CM | POA: Diagnosis not present

## 2017-12-26 ENCOUNTER — Ambulatory Visit: Payer: 59 | Admitting: Podiatry

## 2018-01-01 ENCOUNTER — Ambulatory Visit: Payer: 59 | Admitting: Podiatry

## 2018-01-01 ENCOUNTER — Encounter: Payer: Self-pay | Admitting: Podiatry

## 2018-01-01 DIAGNOSIS — M779 Enthesopathy, unspecified: Secondary | ICD-10-CM | POA: Diagnosis not present

## 2018-01-01 DIAGNOSIS — M778 Other enthesopathies, not elsewhere classified: Secondary | ICD-10-CM

## 2018-01-01 DIAGNOSIS — Q828 Other specified congenital malformations of skin: Secondary | ICD-10-CM | POA: Diagnosis not present

## 2018-01-01 NOTE — Progress Notes (Signed)
She presents today for follow-up of painful sub-fifth metatarsal lesion on her left foot.  Objective: Vital signs stable she is alert and oriented x3 pulses are palpable left.  She still has fluid-filled bursa area.  With a reactive hyperkeratosis present.  Assessment: Bursitis porokeratosis.  Plan: Injection today for bursitis dexamethasone and local anesthetic and debrided reactive hyperkeratosis.

## 2018-01-02 ENCOUNTER — Ambulatory Visit: Payer: 59 | Admitting: Podiatry

## 2018-01-07 ENCOUNTER — Telehealth: Payer: Self-pay | Admitting: Podiatry

## 2018-01-07 NOTE — Telephone Encounter (Signed)
The place Dr. Milinda Pointer cut last week is giving me a lot of burning pain and I didn't know if that was normal at this point? It doesn't look infected to me. It doesn't matter if I'm sitting, standing it just burns sometimes, but not all the time, it just comes and goes. I didn't know if there was any suggestions to help ease the burning sensation? I can be reached at [phone]. Thank you.

## 2018-01-07 NOTE — Telephone Encounter (Signed)
Called pt - advised she could be feeling some rawness from trimming of the callus, but she also had an injection with some cortisone, which could cause a little discomfort. Advised she could try using some ice to the area, also to try some pads to offload the area to help when she is weight bearing.

## 2018-02-10 DIAGNOSIS — J0391 Acute recurrent tonsillitis, unspecified: Secondary | ICD-10-CM | POA: Diagnosis not present

## 2018-02-27 IMAGING — RF DG ESOPHAGUS
12 series · 14 of 24 positions shown · non-contrast
Comparison: None

CLINICAL DATA: Reflux, GERD, dysphagia for solids for several years

EXAM:
ESOPHOGRAM / BARIUM SWALLOW / BARIUM TABLET STUDY
TECHNIQUE: Combined double contrast and single contrast examination performed
using effervescent crystals, thick barium liquid, and thin barium
liquid. The patient was observed with fluoroscopy swallowing a 13 mm
barium sulphate tablet.
FLUOROSCOPY TIME:  Fluoroscopy Time:  2 minutes 6 seconds
Radiation Exposure Index (if provided by the fluoroscopic device):
46.3 mGy
Number of Acquired Spot Images: multiple fluoroscopic screen
captures

[Series 1: cp_standard · 0.18mm/px · 1 of 49 frames shown (1 of 12)]
[frame 8/49]
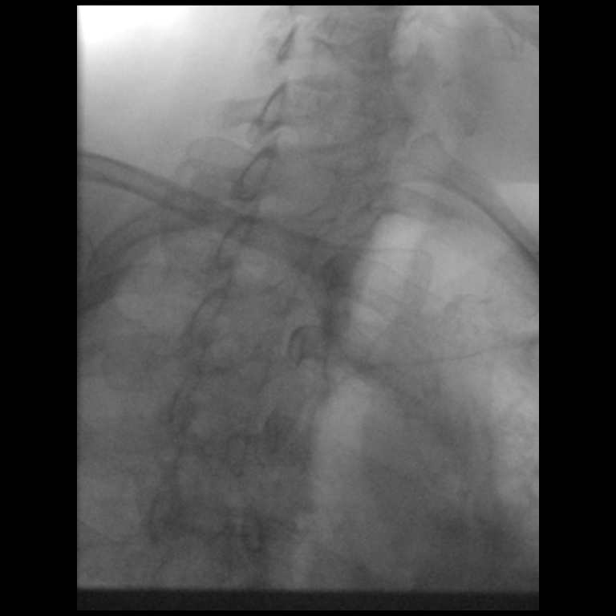

[Series 2: cp_standard · 0.18mm/px · 1 of 72 frames shown (2 of 12)]
[frame 11/72]
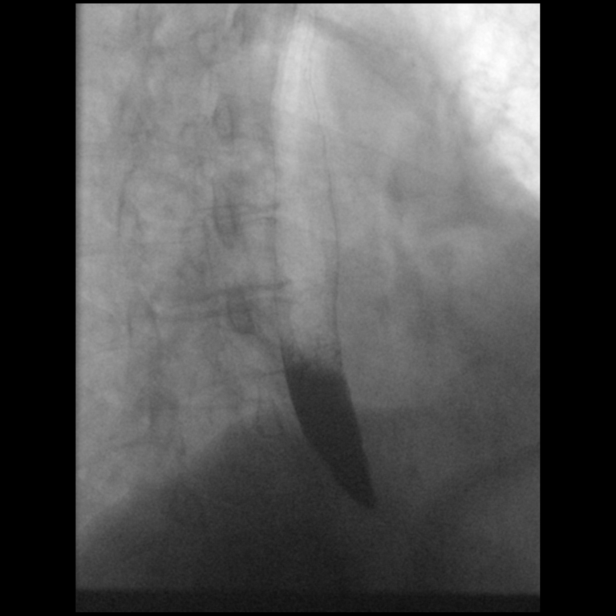

[Series 3: cp_standard · 0.18mm/px · 2 of 48 frames shown (3 of 12)]
[frame 8/48]
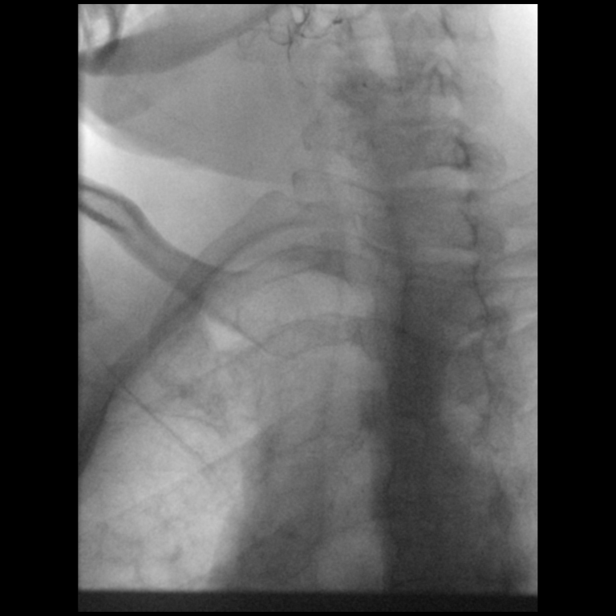
[frame 41/48]
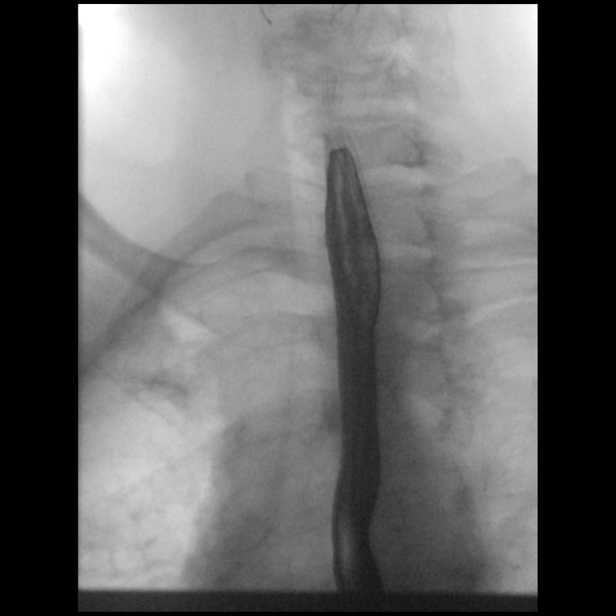

[Series 4: cp_standard · 0.18mm/px · 1 of 73 frames shown (4 of 12)]
[frame 11/73]
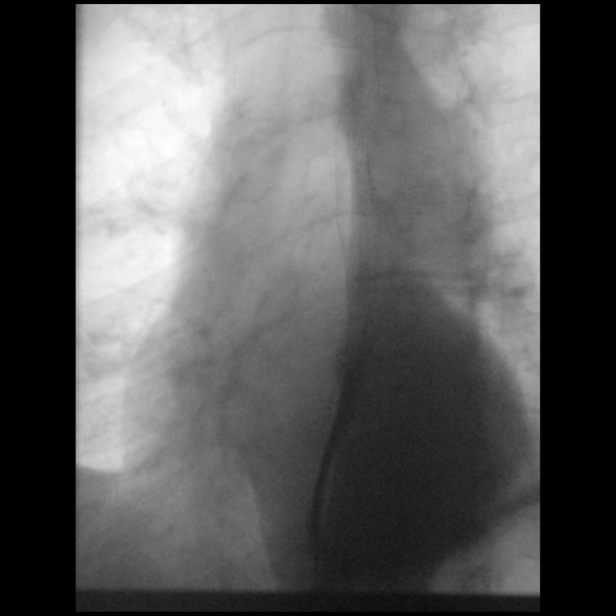

[Series 5: cp_standard · 0.19mm/px · 1 of 68 frames shown (5 of 12)]
[frame 18/68]
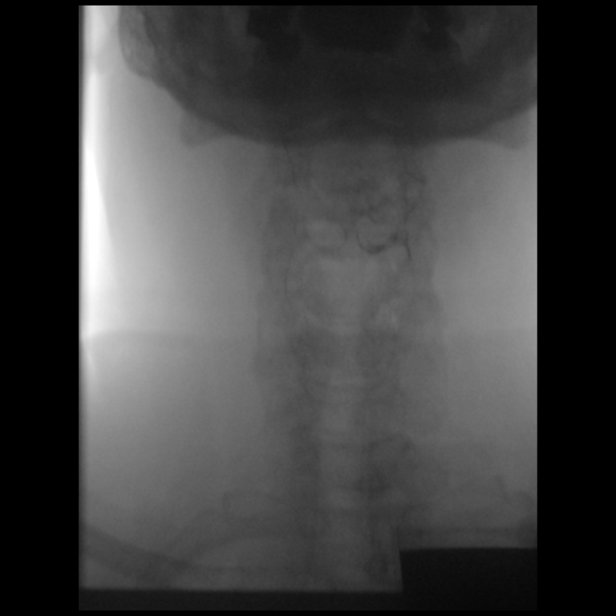

[Series 6: cp_standard · 0.19mm/px · 2 of 118 frames shown (6 of 12)]
[frame 60/118]
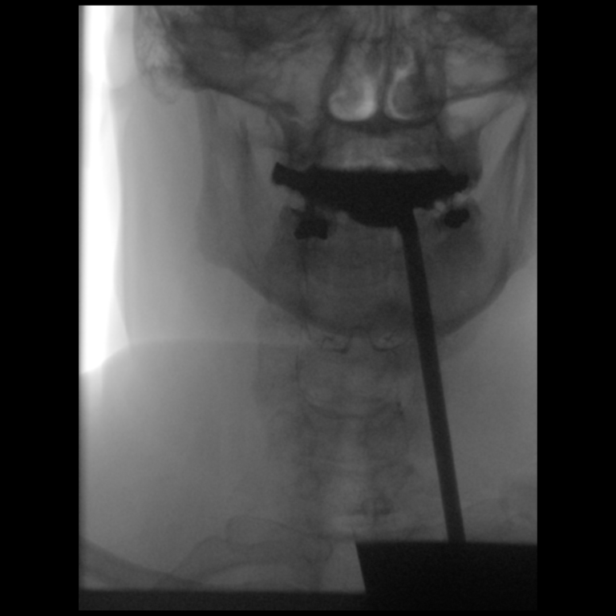
[frame 101/118]
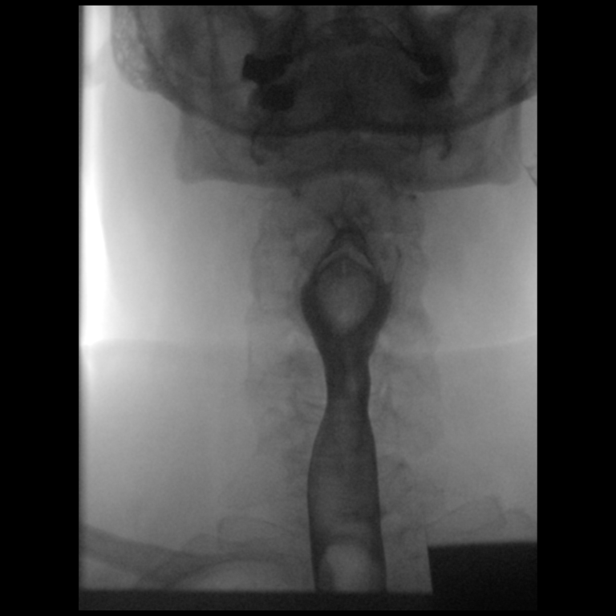

[Series 7: cp_standard · 0.18mm/px · 1 of 48 frames shown (7 of 12)]
[frame 41/48]
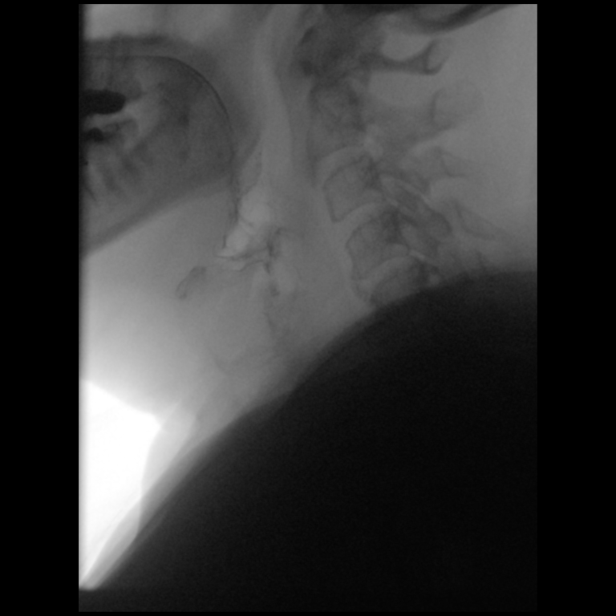

[Series 8: cp_standard · 0.18mm/px · 1 of 61 frames shown (8 of 12)]
[frame 52/61]
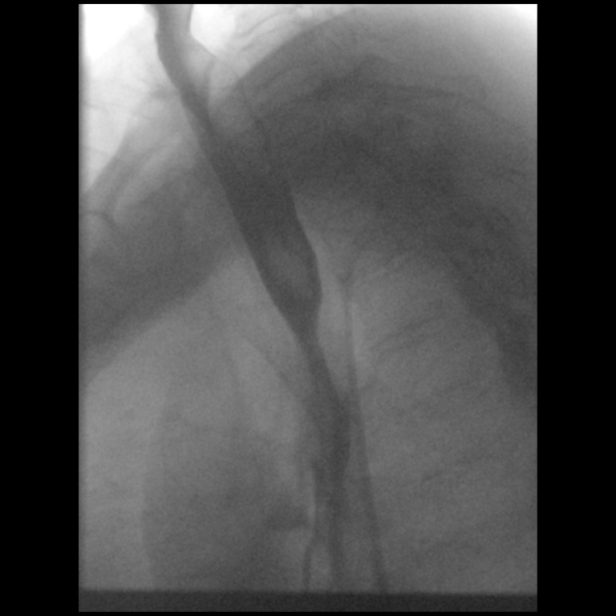

[Series 9: cp_standard · 0.28mm/px · 1 of 26 frames shown (9 of 12)]
[frame 14/26]
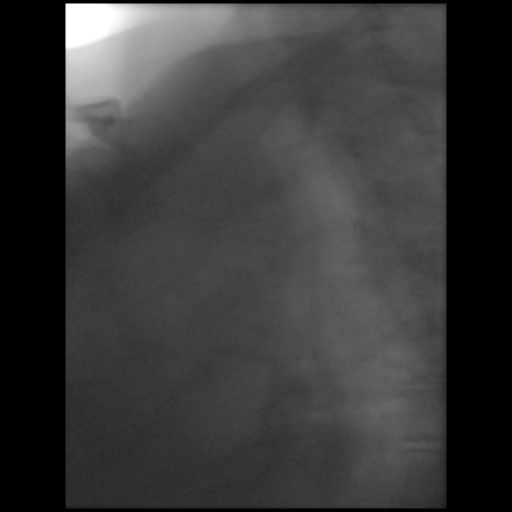

[Series 10: cp_standard · 0.19mm/px · 1 of 69 frames shown (10 of 12)]
[frame 11/69]
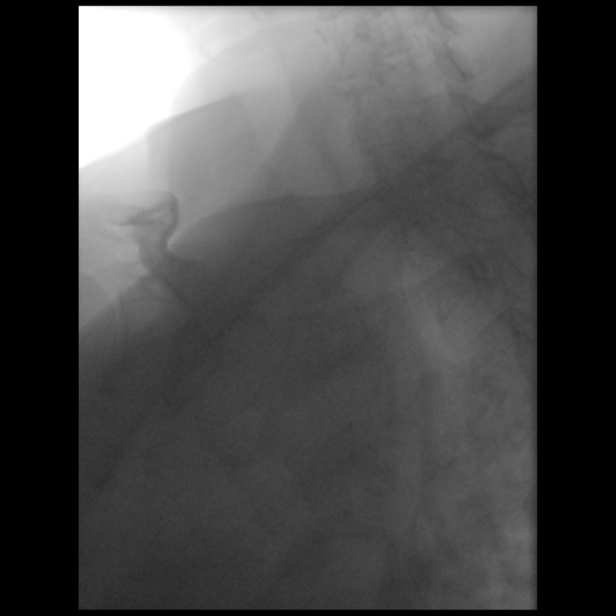

[Series 11: cp_standard · 0.19mm/px · 1 of 176 frames shown (11 of 12)]
[frame 27/176]
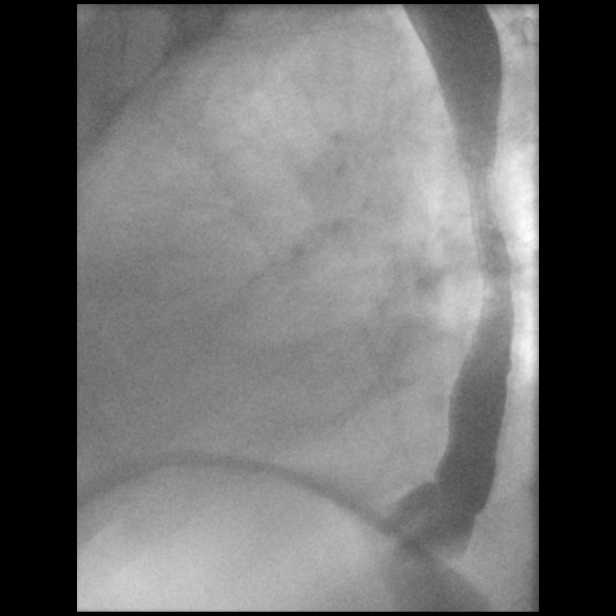

[Series 12: cp_standard · 0.19mm/px · 1 of 1 slices shown (12 of 12)]
[im 1/1]
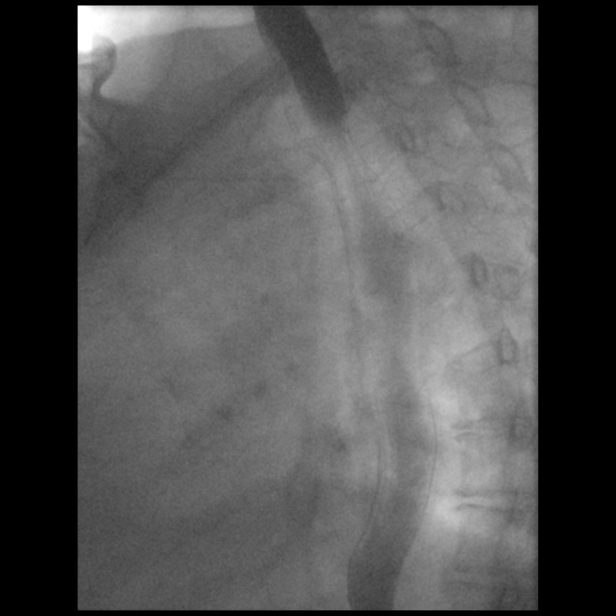

[14 of 24 positions shown; findings below may reference images not displayed]

FINDINGS: Esophageal distention: Normal esophageal distention without mass or
stricture

Filling defects:  Tiny nonobstructing partial Schatzki ring.

12.5 mm barium tablet: Passed from oral cavity to stomach without
delay

Motility:  Age-related esophageal dysmotility

Mucosa:  Smooth mucosa without irregularity or ulceration

Hypopharynx/cervical esophagus: Normal motion without laryngeal
penetration or aspiration

Hiatal hernia:  Small hiatal hernia

GE reflux:  Not visualized during exam

Other:  N/A
IMPRESSION: Age-related esophageal dysmotility.

Tiny nonobstructing partial Schatzki ring just above a small hiatal
hernia.

No evidence of esophageal mass or obstruction.

## 2018-03-03 DIAGNOSIS — H04123 Dry eye syndrome of bilateral lacrimal glands: Secondary | ICD-10-CM | POA: Diagnosis not present

## 2018-03-03 DIAGNOSIS — Z79899 Other long term (current) drug therapy: Secondary | ICD-10-CM | POA: Diagnosis not present

## 2018-03-04 DIAGNOSIS — D229 Melanocytic nevi, unspecified: Secondary | ICD-10-CM | POA: Diagnosis not present

## 2018-03-04 DIAGNOSIS — L814 Other melanin hyperpigmentation: Secondary | ICD-10-CM | POA: Diagnosis not present

## 2018-03-04 DIAGNOSIS — L821 Other seborrheic keratosis: Secondary | ICD-10-CM | POA: Diagnosis not present

## 2018-03-04 DIAGNOSIS — L570 Actinic keratosis: Secondary | ICD-10-CM | POA: Diagnosis not present

## 2018-03-13 DIAGNOSIS — M2550 Pain in unspecified joint: Secondary | ICD-10-CM | POA: Diagnosis not present

## 2018-03-13 DIAGNOSIS — R768 Other specified abnormal immunological findings in serum: Secondary | ICD-10-CM | POA: Diagnosis not present

## 2018-03-13 DIAGNOSIS — M359 Systemic involvement of connective tissue, unspecified: Secondary | ICD-10-CM | POA: Diagnosis not present

## 2018-03-19 DIAGNOSIS — Z23 Encounter for immunization: Secondary | ICD-10-CM | POA: Diagnosis not present

## 2018-03-24 DIAGNOSIS — E785 Hyperlipidemia, unspecified: Secondary | ICD-10-CM | POA: Diagnosis not present

## 2018-03-24 DIAGNOSIS — N390 Urinary tract infection, site not specified: Secondary | ICD-10-CM | POA: Diagnosis not present

## 2018-03-24 DIAGNOSIS — R7302 Impaired glucose tolerance (oral): Secondary | ICD-10-CM | POA: Diagnosis not present

## 2018-03-24 DIAGNOSIS — I10 Essential (primary) hypertension: Secondary | ICD-10-CM | POA: Diagnosis not present

## 2018-03-24 DIAGNOSIS — E7849 Other hyperlipidemia: Secondary | ICD-10-CM | POA: Diagnosis not present

## 2018-03-24 DIAGNOSIS — R5383 Other fatigue: Secondary | ICD-10-CM | POA: Diagnosis not present

## 2018-03-24 DIAGNOSIS — R739 Hyperglycemia, unspecified: Secondary | ICD-10-CM | POA: Diagnosis not present

## 2018-04-02 ENCOUNTER — Ambulatory Visit: Payer: 59 | Admitting: Podiatry

## 2018-06-03 DIAGNOSIS — Z79899 Other long term (current) drug therapy: Secondary | ICD-10-CM | POA: Diagnosis not present

## 2018-06-03 DIAGNOSIS — H04123 Dry eye syndrome of bilateral lacrimal glands: Secondary | ICD-10-CM | POA: Diagnosis not present

## 2018-06-27 DIAGNOSIS — R768 Other specified abnormal immunological findings in serum: Secondary | ICD-10-CM | POA: Diagnosis not present

## 2018-06-27 DIAGNOSIS — M2550 Pain in unspecified joint: Secondary | ICD-10-CM | POA: Diagnosis not present

## 2018-06-27 DIAGNOSIS — M359 Systemic involvement of connective tissue, unspecified: Secondary | ICD-10-CM | POA: Diagnosis not present

## 2018-07-01 DIAGNOSIS — R21 Rash and other nonspecific skin eruption: Secondary | ICD-10-CM | POA: Diagnosis not present

## 2018-07-01 DIAGNOSIS — J019 Acute sinusitis, unspecified: Secondary | ICD-10-CM | POA: Diagnosis not present

## 2018-07-14 DIAGNOSIS — R5383 Other fatigue: Secondary | ICD-10-CM | POA: Diagnosis not present

## 2018-07-14 DIAGNOSIS — E7849 Other hyperlipidemia: Secondary | ICD-10-CM | POA: Diagnosis not present

## 2018-07-14 DIAGNOSIS — I10 Essential (primary) hypertension: Secondary | ICD-10-CM | POA: Diagnosis not present

## 2018-08-04 ENCOUNTER — Encounter (INDEPENDENT_AMBULATORY_CARE_PROVIDER_SITE_OTHER): Payer: Self-pay | Admitting: Internal Medicine

## 2018-08-21 ENCOUNTER — Encounter (INDEPENDENT_AMBULATORY_CARE_PROVIDER_SITE_OTHER): Payer: Self-pay | Admitting: Internal Medicine

## 2018-08-27 ENCOUNTER — Ambulatory Visit (INDEPENDENT_AMBULATORY_CARE_PROVIDER_SITE_OTHER): Payer: 59 | Admitting: Internal Medicine

## 2018-09-23 DIAGNOSIS — R300 Dysuria: Secondary | ICD-10-CM | POA: Diagnosis not present

## 2018-09-23 DIAGNOSIS — R7302 Impaired glucose tolerance (oral): Secondary | ICD-10-CM | POA: Diagnosis not present

## 2018-09-23 DIAGNOSIS — R0981 Nasal congestion: Secondary | ICD-10-CM | POA: Diagnosis not present

## 2018-10-14 DIAGNOSIS — I10 Essential (primary) hypertension: Secondary | ICD-10-CM | POA: Diagnosis not present

## 2018-10-14 DIAGNOSIS — R7302 Impaired glucose tolerance (oral): Secondary | ICD-10-CM | POA: Diagnosis not present

## 2018-10-14 DIAGNOSIS — Z780 Asymptomatic menopausal state: Secondary | ICD-10-CM | POA: Diagnosis not present

## 2018-11-19 DIAGNOSIS — Z79899 Other long term (current) drug therapy: Secondary | ICD-10-CM | POA: Insufficient documentation

## 2018-11-19 DIAGNOSIS — Z1211 Encounter for screening for malignant neoplasm of colon: Secondary | ICD-10-CM | POA: Insufficient documentation

## 2018-12-06 DIAGNOSIS — H9113 Presbycusis, bilateral: Secondary | ICD-10-CM | POA: Insufficient documentation

## 2018-12-06 DIAGNOSIS — H9313 Tinnitus, bilateral: Secondary | ICD-10-CM | POA: Insufficient documentation

## 2019-02-12 ENCOUNTER — Other Ambulatory Visit (INDEPENDENT_AMBULATORY_CARE_PROVIDER_SITE_OTHER): Payer: Self-pay | Admitting: Internal Medicine

## 2019-03-10 ENCOUNTER — Other Ambulatory Visit (INDEPENDENT_AMBULATORY_CARE_PROVIDER_SITE_OTHER): Payer: Self-pay | Admitting: Internal Medicine

## 2019-04-16 ENCOUNTER — Ambulatory Visit (INDEPENDENT_AMBULATORY_CARE_PROVIDER_SITE_OTHER): Payer: 59 | Admitting: Internal Medicine

## 2019-05-19 ENCOUNTER — Other Ambulatory Visit (INDEPENDENT_AMBULATORY_CARE_PROVIDER_SITE_OTHER): Payer: Self-pay | Admitting: Internal Medicine

## 2019-05-19 ENCOUNTER — Telehealth (INDEPENDENT_AMBULATORY_CARE_PROVIDER_SITE_OTHER): Payer: Self-pay

## 2019-05-19 NOTE — Telephone Encounter (Signed)
SHE CALLED AND WANTED TO KNOW HER LAST TEST AND LABS, AND WANTING REFILLS BUT DID NOT HAVE ALL INFORMATION FOR ME.  SO I AM RE ROUTING PEOPLE TO GO TO THE WEB SITE TO START WORKING AND USING TO CONTACT us IF THEY ARE ACTIVE.

## 2019-05-19 NOTE — Telephone Encounter (Signed)
RETURN CALL TO PATIENT. NOTIFIED THAT ALL RESULTS WILL BE SENT  "[facility]".  SHE WANTED TO GO OVER AND REORDER.

## 2019-05-19 NOTE — Telephone Encounter (Signed)
When did she have an appointment?  I do not understand the message.

## 2019-05-19 NOTE — Telephone Encounter (Signed)
Please call, she wants to know if she should have labs and also needs med.

## 2019-05-29 ENCOUNTER — Other Ambulatory Visit (INDEPENDENT_AMBULATORY_CARE_PROVIDER_SITE_OTHER): Payer: Self-pay | Admitting: Internal Medicine

## 2019-06-04 ENCOUNTER — Other Ambulatory Visit (INDEPENDENT_AMBULATORY_CARE_PROVIDER_SITE_OTHER): Payer: Self-pay | Admitting: Internal Medicine

## 2019-06-27 ENCOUNTER — Other Ambulatory Visit (INDEPENDENT_AMBULATORY_CARE_PROVIDER_SITE_OTHER): Payer: Self-pay | Admitting: Internal Medicine

## 2019-07-01 ENCOUNTER — Other Ambulatory Visit (INDEPENDENT_AMBULATORY_CARE_PROVIDER_SITE_OTHER): Payer: Self-pay

## 2019-07-02 ENCOUNTER — Other Ambulatory Visit (INDEPENDENT_AMBULATORY_CARE_PROVIDER_SITE_OTHER): Payer: Self-pay | Admitting: Nurse Practitioner

## 2019-07-02 MED ORDER — NP THYROID 120 MG PO TABS: 120.0000 mg | ORAL_TABLET | Freq: Every day | ORAL | 0 refills | Status: DC

## 2019-07-02 MED ORDER — NP THYROID 30 MG PO TABS: 30.0000 mg | ORAL_TABLET | Freq: Every day | ORAL | 0 refills | Status: DC

## 2019-07-02 NOTE — Telephone Encounter (Signed)
Patient's pharmacy is sending medication refill request.  These medications were refused by Dr. Anastasio Champion because patient needs a appointment set up.  When you have time please give her a call and get her scheduled for an office visit.  Preferably the visit will be done in person as long as she is not having any symptoms associated with COVID-19 and has not had any recent exposures.

## 2019-07-02 NOTE — Progress Notes (Signed)
Santiago Glad, I got your message about the patient being completely out of her NP thyroid.  I do see that she has an appointment coming up next week with Dr. Anastasio Champion.  I have sent a 30-day supply of her NP thyroid to the [facility] in [place].  Per chart review it looks like she takes 2 separate doses so I have sent refill for both of these doses.  I will also route this message to Dr. Anastasio Champion so he is aware.  Thank you.

## 2019-07-06 ENCOUNTER — Other Ambulatory Visit (INDEPENDENT_AMBULATORY_CARE_PROVIDER_SITE_OTHER): Payer: Self-pay

## 2019-07-06 MED ORDER — NP THYROID 30 MG PO TABS: 30.0000 mg | ORAL_TABLET | Freq: Every day | ORAL | 2 refills | Status: DC

## 2019-07-06 MED ORDER — NP THYROID 120 MG PO TABS: 120.0000 mg | ORAL_TABLET | Freq: Every day | ORAL | 2 refills | Status: DC

## 2019-07-06 MED ORDER — PANTOPRAZOLE SODIUM 40 MG PO TBEC: 40.0000 mg | DELAYED_RELEASE_TABLET | Freq: Every day | ORAL | 3 refills | Status: DC

## 2019-07-06 MED ORDER — MONTELUKAST SODIUM 10 MG PO TABS: 10.0000 mg | ORAL_TABLET | Freq: Every day | ORAL | 0 refills | Status: DC

## 2019-07-07 ENCOUNTER — Other Ambulatory Visit: Payer: Self-pay

## 2019-07-07 ENCOUNTER — Ambulatory Visit (INDEPENDENT_AMBULATORY_CARE_PROVIDER_SITE_OTHER): Payer: 59 | Admitting: Internal Medicine

## 2019-07-07 ENCOUNTER — Encounter (INDEPENDENT_AMBULATORY_CARE_PROVIDER_SITE_OTHER): Payer: Self-pay | Admitting: Internal Medicine

## 2019-07-07 VITALS — BP 138/80 | HR 89 | Temp 98.4°F | Resp 18 | Ht 63.0 in | Wt 189.0 lb

## 2019-07-07 DIAGNOSIS — I10 Essential (primary) hypertension: Secondary | ICD-10-CM | POA: Diagnosis not present

## 2019-07-07 DIAGNOSIS — E2839 Other primary ovarian failure: Secondary | ICD-10-CM | POA: Diagnosis not present

## 2019-07-07 DIAGNOSIS — E6609 Other obesity due to excess calories: Secondary | ICD-10-CM

## 2019-07-07 DIAGNOSIS — E119 Type 2 diabetes mellitus without complications: Secondary | ICD-10-CM | POA: Diagnosis not present

## 2019-07-07 DIAGNOSIS — E782 Mixed hyperlipidemia: Secondary | ICD-10-CM | POA: Diagnosis not present

## 2019-07-07 DIAGNOSIS — M359 Systemic involvement of connective tissue, unspecified: Secondary | ICD-10-CM

## 2019-07-07 DIAGNOSIS — Z6833 Body mass index (BMI) 33.0-33.9, adult: Secondary | ICD-10-CM

## 2019-07-07 DIAGNOSIS — E559 Vitamin D deficiency, unspecified: Secondary | ICD-10-CM

## 2019-07-07 NOTE — Progress Notes (Signed)
Metrics: Intervention Frequency ACO  Documented Smoking Status Yearly  Screened one or more times in 24 months  Cessation Counseling or  Active cessation medication Past 24 months  Past 24 months   Guideline developer: UpToDate (See UpToDate for funding source) Date Released: 2014       Wellness Office Visit  Subjective:  Patient ID: Carolyn Stone, female    DOB: 1955/08/29  Age: 64 y.o. MRN: [id number]  CC: This lady comes in after a long hiatus for follow-up of her obesity, bioidentical hormone therapy, hyperlipidemia, diabetes, connective tissue disease. HPI  She has done well with hydroxychloroquine for connective tissue disease and she is following up with [facility] rheumatology. She continues with bioidentical hormone therapy with estradiol and progesterone and I increased this the last time in August of last year.  Unfortunately, she has not been back since that time but she comes now. She continues with NP thyroid before and tolerates it. She was supposed be taking vitamin D3 supplementation 10,000 units daily but I do not think she has been consistent with it. Fortunately, she continues to be fairly consistent with nutrition and does intermittent fasting and has maintained her weight. Past Medical History:  Diagnosis Date  . Allergy   . Anxiety   . Arthritis   . Dysphagia 12/25/2016  . Myalgia and myositis    "undetermined inflammatory process"      Family History  Problem Relation Age of Onset  . Cancer Mother        squamous cell-larygopharyngeal-died age 32  . Cancer Father        lung/liver cancer- died age 39  . Mental illness Father        ?schizophrenia  . Hypertension Brother   . Mental illness Sister        Bipolar Disorder  . Cancer Maternal Grandmother   . Hypertension Paternal Grandmother   . Hyperlipidemia Paternal Grandmother   . Stroke Paternal Grandfather     Social History   Social History Narrative   Lives with her husband.  Their 2  daughters are grown and live independently.   Social History   Tobacco Use  . Smoking status: Never Smoker  . Smokeless tobacco: Never Used  Substance Use Topics  . Alcohol use: No    Current Meds  Medication Sig  . diclofenac sodium (VOLTAREN) 1 % GEL Apply 2 g topically 4 (four) times daily. Prn pain  . enalapril (VASOTEC) 5 MG tablet TAKE ONE TABLET BY MOUTH ONCE DAILY (Patient taking differently: twice a day)  . estradiol (ESTRACE) 2 MG tablet Take 1 tablet by mouth once daily  . hydroxychloroquine (PLAQUENIL) 200 MG tablet Take 400 mg by mouth daily.  . montelukast (SINGULAIR) 10 MG tablet Take 1 tablet (10 mg total) by mouth daily.  . NP THYROID 120 MG tablet Take 1 tablet (120 mg total) by mouth daily.  . pantoprazole (PROTONIX) 40 MG tablet Take 1 tablet (40 mg total) by mouth daily.  . progesterone (PROMETRIUM) 200 MG capsule Take 1 capsule by mouth once daily  . [DISCONTINUED] estradiol (ESTRACE) 1 MG tablet   . [DISCONTINUED] Fexofenadine-Pseudoephedrine (ALLEGRA-D 12 HOUR PO) Take by mouth.  . [DISCONTINUED] metFORMIN (GLUCOPHAGE) 500 MG tablet TAKE ONE TABLET BY MOUTH TWICE DAILY WITH MEALS *NEED OFFICE VISIT* (Patient taking differently: TAKE ONE TABLET BY MOUTH daily)  . [DISCONTINUED] NP THYROID 30 MG tablet Take 1 tablet (30 mg total) by mouth daily.      Bio Identical Hormones  This patient is being treated with desiccated thyroid, off label, for symptoms of thyroid deficiency.  The patient has been counseled regarding side effects and how to deal with them.  Micronized progesterone is being used in this patient for multiple benefits based on studies including protection against uterine cancer, breast cancer, osteoporosis and heart disease. The patient has been counseled regarding side effects, benefits and modes of administration. The patient is agreeable that this therapy is an integral part of her wellness, quality of life and prevention of chronic  disease.  Estradiol is being used in this patient for multiple benefits based on several studies including protection against heart disease, cerebrovascular disease, osteoporosis, colon cancer, Alzheimer's disease, macular degeneration and cataracts. The patient has been counseled regarding benefits and side effects and modes of administration. The patient is agreeable that this therapy is an integral to part of her wellness, quality of life and prevention of chronic disease.  Objective:   Today's Vitals: BP 138/80 (BP Location: Right Arm, Patient Position: Sitting, Cuff Size: Normal)   Pulse 89   Temp 98.4 F (36.9 C) (Temporal)   Resp 18   Ht 5\' 3"  (1.6 m)   Wt 189 lb (85.7 kg)   SpO2 98% Comment: wearing mask.  BMI 33.48 kg/m  Vitals with BMI 07/07/2019 12/25/2016 06/15/2015  Height 5\' 3"  5\' 2"  5\' 2"   Weight 189 lbs 194 lbs 2 oz 187 lbs  BMI 33.49 [id number] [id number]  Systolic 0000000 [id number]  Diastolic 80 90 90  Pulse 89 64 -     Physical Exam  She looks systemically well.  Weight is stable from when I saw her in August of last year.  She is alert and orientated without any focal neurological signs.     Assessment   1. Diabetes mellitus without complication ([place])   2. Essential hypertension   3. Mixed hyperlipidemia   4. Primary ovarian failure   5. Connective tissue disease (HCC)   6. Class 1 obesity due to excess calories without serious comorbidity with body mass index (BMI) of 33.0 to 33.9 in adult   7. Vitamin D deficiency disease       Tests ordered Orders Placed This Encounter  Procedures  . COMPLETE METABOLIC PANEL WITH GFR  . Hemoglobin A1c  . VITAMIN D 25 Hydroxy (Vit-D Deficiency, Fractures)  . T3, free  . TSH  . Estradiol  . Progesterone     Plan: 1. Blood work is ordered above. 2. She will continue with ACE inhibitor for hypertension. 3. She will continue with bioidentical hormone therapy with estradiol and progesterone and we will check levels. 4. I had  tried her on testosterone previously but this gave her side effects. 5. She will continue with NP thyroid 120 mg daily and we will check levels again. 6. Further recommendations will depend on blood results and I will see her in 3 months time for follow-up   No orders of the defined types were placed in this encounter.   Doree Albee, MD

## 2019-07-07 NOTE — Patient Instructions (Signed)
[facility] Dietary Recommendations for Weight Loss What to Avoid . Avoid added sugars o Often added sugar can be found in processed foods such as many condiments, dry cereals, cakes, cookies, chips, crisps, crackers, candies, sweetened drinks, etc.  o Read labels and AVOID/DECREASE use of foods with the following in their ingredient list: Sugar, fructose, high fructose corn syrup, sucrose, glucose, maltose, dextrose, molasses, cane sugar, brown sugar, any type of syrup, agave nectar, etc.   . Avoid snacking in between meals . Avoid foods made with flour o If you are going to eat food made with flour, choose those made with whole-grains; and, minimize your consumption as much as is tolerable . Avoid processed foods o These foods are generally stocked in the middle of the grocery store. Focus on shopping on the perimeter of the grocery.  . Avoid Meat  o We recommend following a plant-based diet at [facility]. Thus, we recommend avoiding meat as a general rule. Consider eating beans, legumes, eggs, and/or dairy products for regular protein sources o If you plan on eating meat limit to 4 ounces of meat at a time and choose lean options such as Fish, chicken, turkey. Avoid red meat intake such as pork and/or steak What to Include . Vegetables o GREEN LEAFY VEGETABLES: Kale, spinach, mustard greens, collard greens, cabbage, broccoli, etc. o OTHER: Asparagus, cauliflower, eggplant, carrots, peas, Brussel sprouts, tomatoes, bell peppers, zucchini, beets, cucumbers, etc. . Grains, seeds, and legumes o Beans: kidney beans, black eyed peas, garbanzo beans, black beans, pinto beans, etc. o Whole, unrefined grains: brown rice, barley, bulgur, oatmeal, etc. . Healthy fats  o Avoid highly processed fats such as vegetable oil o Examples of healthy fats: avocado, olives, virgin olive oil, dark chocolate (?72% Cocoa), nuts (peanuts, almonds, walnuts, cashews, pecans, etc.) . None to Low  Intake of Animal Sources of Protein o Meat sources: chicken, turkey, salmon, tuna. Limit to 4 ounces of meat at one time. o Consider limiting dairy sources, but when choosing dairy focus on: PLAIN Greek yogurt, cottage cheese, high-protein milk . Fruit o Choose berries  When to Eat . Intermittent Fasting: o Choosing not to eat for a specific time period, but DO FOCUS ON HYDRATION when fasting o Multiple Techniques: - Time Restricted Eating: eat 3 meals in a day, each meal lasting no more than 60 minutes, no snacks between meals - 16-18 hour fast: fast for 16 to 18 hours up to 7 days a week. Often suggested to start with 2-3 nonconsecutive days per week.  . Remember the time you sleep is counted as fasting.  . Examples of eating schedule: Fast from 7:00pm-11:00am. Eat between 11:00am-7:00pm.  - 24-hour fast: fast for 24 hours up to every other day. Often suggested to start with 1 day per week . Remember the time you sleep is counted as fasting . Examples of eating schedule:  o Eating day: eat 2-3 meals on your eating day. If doing 2 meals, each meal should last no more than 90 minutes. If doing 3 meals, each meal should last no more than 60 minutes. Finish last meal by 7:00pm. o Fasting day: Fast until 7:00pm.  o IF YOU FEEL UNWELL FOR ANY REASON/IN ANY WAY WHEN FASTING, STOP FASTING BY EATING A NUTRITIOUS SNACK OR LIGHT MEAL o ALWAYS FOCUS ON HYDRATION DURING FASTS - Acceptable Hydration sources: water, broths, tea/coffee (black tea/coffee is best but using a small amount of whole-fat dairy products in coffee/tea is acceptable).  -   Poor Hydration Sources: anything with sugar or artificial sweeteners added to it  These recommendations have been developed for patients that are actively receiving medical care from either Dr. Sarah Baez or Sarah Gray, DNP, NP-C at [facility]. These recommendations are developed for patients with specific medical conditions and are not meant to be  distributed or used by others that are not actively receiving care from either provider listed above at [facility]. It is not appropriate to participate in the above eating plans without proper medical supervision.   Reference: Fung, J. The obesity code. [place]: [place]; 2016.   

## 2019-07-08 LAB — COMPLETE METABOLIC PANEL WITH GFR
AG Ratio: 1.2 (calc) (ref 1.0–2.5)
ALT: 14 U/L (ref 6–29)
AST: 16 U/L (ref 10–35)
Albumin: 3.8 g/dL (ref 3.6–5.1)
Alkaline phosphatase (APISO): 25 U/L — ABNORMAL LOW (ref 37–153)
BUN: 13 mg/dL (ref 7–25)
CO2: 24 mmol/L (ref 20–32)
Calcium: 9.3 mg/dL (ref 8.6–10.4)
Chloride: 105 mmol/L (ref 98–110)
Creat: 0.88 mg/dL (ref 0.50–0.99)
GFR, Est African American: 81 mL/min/{1.73_m2} (ref >=60)
GFR, Est Non African American: 70 mL/min/{1.73_m2} (ref >=60)
Globulin: 3.1 g/dL (calc) (ref 1.9–3.7)
Glucose, Bld: 123 mg/dL — ABNORMAL HIGH (ref 65–99)
Potassium: 4.3 mmol/L (ref 3.5–5.3)
Sodium: 139 mmol/L (ref 135–146)
Total Bilirubin: 0.5 mg/dL (ref 0.2–1.2)
Total Protein: 6.9 g/dL (ref 6.1–8.1)

## 2019-07-08 LAB — HEMOGLOBIN A1C
Hgb A1c MFr Bld: 5.5 % of total Hgb (ref <5.7)
Mean Plasma Glucose: 111 (calc)
eAG (mmol/L): 6.2 (calc)

## 2019-07-08 LAB — PROGESTERONE: Progesterone: 11.8 ng/mL

## 2019-07-08 LAB — T3, FREE: T3, Free: 3.4 pg/mL (ref 2.3–4.2)

## 2019-07-08 LAB — TSH: TSH: 2.28 mIU/L (ref 0.40–4.50)

## 2019-07-08 LAB — VITAMIN D 25 HYDROXY (VIT D DEFICIENCY, FRACTURES): Vit D, 25-Hydroxy: 33 ng/mL (ref 30–100)

## 2019-07-08 LAB — ESTRADIOL: Estradiol: 101 pg/mL

## 2019-07-15 NOTE — Progress Notes (Signed)
Pt came into office and was given instructions.

## 2019-08-09 ENCOUNTER — Other Ambulatory Visit (INDEPENDENT_AMBULATORY_CARE_PROVIDER_SITE_OTHER): Payer: Self-pay | Admitting: Internal Medicine

## 2019-08-13 ENCOUNTER — Ambulatory Visit (HOSPITAL_COMMUNITY)
Admission: RE | Admit: 2019-08-13 | Discharge: 2019-08-13 | Disposition: A | Discharge status: Home / Self Care | Payer: 59 | Source: Ambulatory Visit | Attending: Nurse Practitioner | Admitting: Nurse Practitioner

## 2019-08-13 ENCOUNTER — Encounter (INDEPENDENT_AMBULATORY_CARE_PROVIDER_SITE_OTHER): Payer: Self-pay | Admitting: Nurse Practitioner

## 2019-08-13 ENCOUNTER — Ambulatory Visit (INDEPENDENT_AMBULATORY_CARE_PROVIDER_SITE_OTHER): Payer: 59 | Admitting: Nurse Practitioner

## 2019-08-13 ENCOUNTER — Other Ambulatory Visit: Payer: Self-pay

## 2019-08-13 VITALS — BP 138/75 | HR 84 | Temp 97.7°F | Ht 63.0 in | Wt 192.8 lb

## 2019-08-13 DIAGNOSIS — R6889 Other general symptoms and signs: Secondary | ICD-10-CM

## 2019-08-13 DIAGNOSIS — M79662 Pain in left lower leg: Secondary | ICD-10-CM | POA: Insufficient documentation

## 2019-08-13 NOTE — Progress Notes (Signed)
Subjective:  Patient ID: Carolyn Stone, female    DOB: 16-Oct-1955  Age: 64 y.o. MRN: [id number]  CC:  Chief Complaint  Patient presents with  . Leg Pain    LEFT LEG HEATING UP       HPI  This patient comes in today for an acute visit for the above.   Her symptoms started last week.  She tells me she initially felt a warming sensation that traveled from her left foot up the back of her calf only when she was lying or sitting down.  Now it has changed to go all the way up to her thigh.  Walking will now elicit the symptoms, whereas before only certain positions would elicit them.  She denies any swelling or significant pain, however the warming sensation is uncomfortable and worrisome enough for her for her to seek care today.  She tells me that she recently changed shoes and has been walking more regularly.  She does have history of foot surgeries bilaterally.  She denies any new sensation changes or weakness.  She denies any discoloration to the skin.   Past Medical History:  Diagnosis Date  . Allergy   . Anxiety   . Arthritis   . Dysphagia 12/25/2016  . Myalgia and myositis    "undetermined inflammatory process"      Family History  Problem Relation Age of Onset  . Cancer Mother        squamous cell-larygopharyngeal-died age 8  . Cancer Father        lung/liver cancer- died age 64  . Mental illness Father        ?schizophrenia  . Hypertension Brother   . Mental illness Sister        Bipolar Disorder  . Cancer Maternal Grandmother   . Hypertension Paternal Grandmother   . Hyperlipidemia Paternal Grandmother   . Stroke Paternal Grandfather     Social History   Social History Narrative   Lives with her husband.  Their 2 daughters are grown and live independently.   Social History   Tobacco Use  . Smoking status: Never Smoker  . Smokeless tobacco: Never Used  Substance Use Topics  . Alcohol use: No     Current Meds  Medication Sig  . diclofenac  sodium (VOLTAREN) 1 % GEL Apply 2 g topically 4 (four) times daily. Prn pain  . enalapril (VASOTEC) 5 MG tablet Take 1 tablet by mouth twice daily  . estradiol (ESTRACE) 2 MG tablet Take 1 tablet by mouth once daily  . hydroxychloroquine (PLAQUENIL) 200 MG tablet Take 400 mg by mouth daily.  . montelukast (SINGULAIR) 10 MG tablet Take 1 tablet (10 mg total) by mouth daily.  . NP THYROID 120 MG tablet Take 1 tablet (120 mg total) by mouth daily.  . pantoprazole (PROTONIX) 40 MG tablet Take 1 tablet (40 mg total) by mouth daily.  . progesterone (PROMETRIUM) 200 MG capsule Take 1 capsule by mouth once daily    ROS:  Review of Systems  Respiratory: Negative for shortness of breath.   Cardiovascular: Negative for chest pain and leg swelling.  Skin: Negative for rash.  Neurological: Negative for sensory change and weakness.     Objective:   Today's Vitals: BP 138/75 (BP Location: Left Arm, Patient Position: Sitting, Cuff Size: Normal)   Pulse 84   Temp 97.7 F (36.5 C) (Temporal)   Ht 5\' 3"  (1.6 m)   Wt 192 lb 12.8 oz (87.5  kg)   SpO2 94%   BMI 34.15 kg/m  Vitals with BMI 08/13/2019 07/07/2019 12/25/2016  Height 5\' 3"  5\' 3"  5\' 2"   Weight 192 lbs 13 oz 189 lbs 194 lbs 2 oz  BMI 34.16 [id number] [id number]  Systolic 0000000 0000000 [id number]  Diastolic 75 80 90  Pulse 84 89 64     Physical Exam Cardiovascular:     Pulses:          Dorsalis pedis pulses are 2+ on the right side and 2+ on the left side.  Feet:     Right foot:     Skin integrity: Skin integrity normal.     Toenail Condition: Right toenails are normal.     Left foot:     Skin integrity: Skin integrity normal.     Toenail Condition: Left toenails are normal.  Skin:    Findings: No erythema or rash.     Comments: (-) swelling  Neurological:     Mental Status: She is alert and oriented to person, place, and time.     Sensory: Sensation is intact.     Motor: Motor function is intact.     Coordination: Coordination is intact.           Assessment and Plan   1. Sensation of feeling hot   2. Pain of left calf      Plan: 1.,2.  Etiology uncertain at this time.  I do think it is worth sending her to radiology to rule out blood clot.  I will send her for Doppler ultrasound today.  Sensation seems to be at baseline, motor function is intact as well.  I recommended that if there is no blood clot found on ultrasound that she should call her podiatrist for further discussion and possible evaluation.  She tells me she understands.  Will await ultrasound results.   Tests ordered Orders Placed This Encounter  Procedures  . US Venous Img Lower Unilateral Left      No orders of the defined types were placed in this encounter.   Patient to follow-up in as scheduled or sooner if necessary.  Ailene Ards, NP

## 2019-08-19 ENCOUNTER — Other Ambulatory Visit (INDEPENDENT_AMBULATORY_CARE_PROVIDER_SITE_OTHER): Payer: Self-pay

## 2019-08-19 MED ORDER — FLUTICASONE PROPIONATE 50 MCG/ACT NA SUSP: 1.0000 | Freq: Two times a day (BID) | NASAL | 3 refills | Status: DC

## 2019-08-26 ENCOUNTER — Other Ambulatory Visit (INDEPENDENT_AMBULATORY_CARE_PROVIDER_SITE_OTHER): Payer: Self-pay | Admitting: Internal Medicine

## 2019-09-09 ENCOUNTER — Ambulatory Visit: Payer: 59 | Admitting: Podiatry

## 2019-10-12 ENCOUNTER — Ambulatory Visit (INDEPENDENT_AMBULATORY_CARE_PROVIDER_SITE_OTHER): Payer: 59 | Admitting: Internal Medicine

## 2019-10-12 ENCOUNTER — Other Ambulatory Visit: Payer: Self-pay

## 2019-10-12 ENCOUNTER — Encounter (INDEPENDENT_AMBULATORY_CARE_PROVIDER_SITE_OTHER): Payer: Self-pay | Admitting: Internal Medicine

## 2019-10-12 VITALS — BP 130/75 | HR 94 | Temp 97.9°F | Ht 63.0 in | Wt 194.0 lb

## 2019-10-12 DIAGNOSIS — R7303 Prediabetes: Secondary | ICD-10-CM

## 2019-10-12 DIAGNOSIS — E782 Mixed hyperlipidemia: Secondary | ICD-10-CM | POA: Diagnosis not present

## 2019-10-12 DIAGNOSIS — I10 Essential (primary) hypertension: Secondary | ICD-10-CM

## 2019-10-12 DIAGNOSIS — E559 Vitamin D deficiency, unspecified: Secondary | ICD-10-CM

## 2019-10-12 DIAGNOSIS — E2839 Other primary ovarian failure: Secondary | ICD-10-CM

## 2019-10-12 DIAGNOSIS — E6609 Other obesity due to excess calories: Secondary | ICD-10-CM

## 2019-10-12 DIAGNOSIS — Z6833 Body mass index (BMI) 33.0-33.9, adult: Secondary | ICD-10-CM

## 2019-10-12 NOTE — Patient Instructions (Signed)
[facility] Dietary Recommendations for Weight Loss What to Avoid . Avoid added sugars o Often added sugar can be found in processed foods such as many condiments, dry cereals, cakes, cookies, chips, crisps, crackers, candies, sweetened drinks, etc.  o Read labels and AVOID/DECREASE use of foods with the following in their ingredient list: Sugar, fructose, high fructose corn syrup, sucrose, glucose, maltose, dextrose, molasses, cane sugar, brown sugar, any type of syrup, agave nectar, etc.   . Avoid snacking in between meals . Avoid foods made with flour o If you are going to eat food made with flour, choose those made with whole-grains; and, minimize your consumption as much as is tolerable . Avoid processed foods o These foods are generally stocked in the middle of the grocery store. Focus on shopping on the perimeter of the grocery.  . Avoid Meat  o We recommend following a plant-based diet at [facility]. Thus, we recommend avoiding meat as a general rule. Consider eating beans, legumes, eggs, and/or dairy products for regular protein sources o If you plan on eating meat limit to 4 ounces of meat at a time and choose lean options such as Fish, chicken, turkey. Avoid red meat intake such as pork and/or steak What to Include . Vegetables o GREEN LEAFY VEGETABLES: Kale, spinach, mustard greens, collard greens, cabbage, broccoli, etc. o OTHER: Asparagus, cauliflower, eggplant, carrots, peas, Brussel sprouts, tomatoes, bell peppers, zucchini, beets, cucumbers, etc. . Grains, seeds, and legumes o Beans: kidney beans, black eyed peas, garbanzo beans, black beans, pinto beans, etc. o Whole, unrefined grains: brown rice, barley, bulgur, oatmeal, etc. . Healthy fats  o Avoid highly processed fats such as vegetable oil o Examples of healthy fats: avocado, olives, virgin olive oil, dark chocolate (?72% Cocoa), nuts (peanuts, almonds, walnuts, cashews, pecans, etc.) . None to Low  Intake of Animal Sources of Protein o Meat sources: chicken, turkey, salmon, tuna. Limit to 4 ounces of meat at one time. o Consider limiting dairy sources, but when choosing dairy focus on: PLAIN Greek yogurt, cottage cheese, high-protein milk . Fruit o Choose berries  When to Eat . Intermittent Fasting: o Choosing not to eat for a specific time period, but DO FOCUS ON HYDRATION when fasting o Multiple Techniques: - Time Restricted Eating: eat 3 meals in a day, each meal lasting no more than 60 minutes, no snacks between meals - 16-18 hour fast: fast for 16 to 18 hours up to 7 days a week. Often suggested to start with 2-3 nonconsecutive days per week.  . Remember the time you sleep is counted as fasting.  . Examples of eating schedule: Fast from 7:00pm-11:00am. Eat between 11:00am-7:00pm.  - 24-hour fast: fast for 24 hours up to every other day. Often suggested to start with 1 day per week . Remember the time you sleep is counted as fasting . Examples of eating schedule:  o Eating day: eat 2-3 meals on your eating day. If doing 2 meals, each meal should last no more than 90 minutes. If doing 3 meals, each meal should last no more than 60 minutes. Finish last meal by 7:00pm. o Fasting day: Fast until 7:00pm.  o IF YOU FEEL UNWELL FOR ANY REASON/IN ANY WAY WHEN FASTING, STOP FASTING BY EATING A NUTRITIOUS SNACK OR LIGHT MEAL o ALWAYS FOCUS ON HYDRATION DURING FASTS - Acceptable Hydration sources: water, broths, tea/coffee (black tea/coffee is best but using a small amount of whole-fat dairy products in coffee/tea is acceptable).  -   Poor Hydration Sources: anything with sugar or artificial sweeteners added to it  These recommendations have been developed for patients that are actively receiving medical care from either Dr. [facility] or Sarah Gray, DNP, NP-C at [facility]. These recommendations are developed for patients with specific medical conditions and are not meant to be  distributed or used by others that are not actively receiving care from either provider listed above at [facility]. It is not appropriate to participate in the above eating plans without proper medical supervision.   Reference: Fung, J. The obesity code. [place]: [place]; 2016.   

## 2019-10-12 NOTE — Progress Notes (Signed)
Metrics: Intervention Frequency ACO  Documented Smoking Status Yearly  Screened one or more times in 24 months  Cessation Counseling or  Active cessation medication Past 24 months  Past 24 months   Guideline developer: UpToDate (See UpToDate for funding source) Date Released: 2014       Wellness Office Visit  Subjective:  Patient ID: Carolyn Stone, female    DOB: 1955/12/30  Age: 64 y.o. MRN: [id number]  CC: This lady comes in for follow-up of her obesity, hypertension, prediabetes, menopausal state, hyperlipidemia. HPI  She is doing reasonably well but is frustrated that she has not lost further weight. She is tolerating bioidentical hormone therapy but she does complain of breast tenderness.  This is a more recent symptom. She is also complaining of right ear fullness. She can also has vitamin D deficiency and continues with vitamin D3 although not on a consistent basis. She continues on antihypertensive therapy without any problems.  She denies any chest pain, dyspnea, palpitations or limb weakness. Past Medical History:  Diagnosis Date  . Allergy   . Anxiety   . Arthritis   . Dysphagia 12/25/2016  . Myalgia and myositis    "undetermined inflammatory process"      Family History  Problem Relation Age of Onset  . Cancer Mother        squamous cell-larygopharyngeal-died age 72  . Cancer Father        lung/liver cancer- died age 82  . Mental illness Father        ?schizophrenia  . Hypertension Brother   . Mental illness Sister        Bipolar Disorder  . Cancer Maternal Grandmother   . Hypertension Paternal Grandmother   . Hyperlipidemia Paternal Grandmother   . Stroke Paternal Grandfather     Social History   Social History Narrative   Lives with her husband.  Their 2 daughters are grown and live independently.   Social History   Tobacco Use  . Smoking status: Never Smoker  . Smokeless tobacco: Never Used  Substance Use Topics  . Alcohol use: No     Current Meds  Medication Sig  . Cholecalciferol (VITAMIN D3) 125 MCG (5000 UT) TABS Take 2 tablets by mouth daily.  . diclofenac sodium (VOLTAREN) 1 % GEL Apply 2 g topically 4 (four) times daily. Prn pain  . enalapril (VASOTEC) 5 MG tablet Take 1 tablet by mouth twice daily (Patient taking differently: Take 5 mg by mouth daily. )  . estradiol (ESTRACE) 2 MG tablet Take 1 tablet by mouth once daily  . fluticasone (FLONASE) 50 MCG/ACT nasal spray Place 1 spray into both nostrils 2 (two) times daily.  . hydroxychloroquine (PLAQUENIL) 200 MG tablet Take 400 mg by mouth daily.  . montelukast (SINGULAIR) 10 MG tablet Take 1 tablet (10 mg total) by mouth daily.  . NP THYROID 120 MG tablet Take 1 tablet (120 mg total) by mouth daily.  . pantoprazole (PROTONIX) 40 MG tablet Take 1 tablet (40 mg total) by mouth daily.  . progesterone (PROMETRIUM) 200 MG capsule Take 1 capsule by mouth once daily       Objective:   Today's Vitals: BP 130/75 (BP Location: Left Arm, Patient Position: Sitting, Cuff Size: Normal)   Pulse 94   Temp 97.9 F (36.6 C) (Temporal)   Ht 5\' 3"  (1.6 m)   Wt 194 lb (88 kg)   SpO2 96%   BMI 34.37 kg/m  Vitals with BMI 10/12/2019 08/13/2019 07/07/2019  Height 5\' 3"  5\' 3"  5\' 3"   Weight 194 lbs 192 lbs 13 oz 189 lbs  BMI 34.37 0000000 [id number]  Systolic [id number] 0000000 0000000  Diastolic 75 75 80  Pulse 94 84 89     Physical Exam  She looks systemically well but remains obese.  Blood pressure is in good control.     Assessment   1. Essential hypertension   2. Mixed hyperlipidemia   3. Vitamin D deficiency disease   4. Primary ovarian failure   5. Class 1 obesity due to excess calories without serious comorbidity with body mass index (BMI) of 33.0 to 33.9 in adult   6. Prediabetes       Tests ordered Orders Placed This Encounter  Procedures  . Cardio IQ Adv Lipid and Inflamm Pnl     Plan: 1. I will check a cardio IQ panel to see the state of inflammation as well  as other cholesterol numbers. 2. She will continue with antihypertensive therapy which seems to be controlling her blood pressure well. 3. She will continue with bioidentical hormone therapy.  Is possible the estradiol level is low but too high for her in terms of her breast tenderness.  She is willing to put up with this for the time being and we will check it on the next visit. 4. In terms of nutrition, she continues to do intermittent fasting but we also today talked about a plant-based diet which she may consider.  I think she may be more successful in losing weight if she does engage with a plant-based diet. 5. Follow-up in 3 months for an annual physical exam.   No orders of the defined types were placed in this encounter.   Doree Albee, MD

## 2019-10-15 LAB — CARDIO IQ ADV LIPID AND INFLAMM PNL
Apolipoprotein B: 93 mg/dL — ABNORMAL HIGH (ref <90)
Cholesterol: 176 mg/dL (ref <200)
HDL: 52 mg/dL (ref >49)
LDL Cholesterol (Calc): 99 mg/dL (calc) (ref <100)
LDL Large: 8064 nmol/L (ref >6729)
LDL Medium: 321 nmol/L — ABNORMAL HIGH (ref <215)
LDL Particle Number: 1642 nmol/L — ABNORMAL HIGH (ref <1138)
LDL Peak Size: 212.1 Angstrom — ABNORMAL LOW (ref >222.9)
LDL Small: 400 nmol/L — ABNORMAL HIGH (ref <142)
Lipoprotein (a): <10 nmol/L (ref <75)
Non-HDL Cholesterol (Calc): 124 mg/dL (calc) (ref <130)
PLAC: 120 nmol/min/mL (ref <124)
Total CHOL/HDL Ratio: 3.4 calc (ref <3.6)
Triglycerides: 148 mg/dL (ref <150)
hs-CRP: >10 mg/L — ABNORMAL HIGH (ref <1.0)

## 2019-11-02 ENCOUNTER — Other Ambulatory Visit (INDEPENDENT_AMBULATORY_CARE_PROVIDER_SITE_OTHER): Payer: Self-pay

## 2019-11-02 ENCOUNTER — Other Ambulatory Visit (INDEPENDENT_AMBULATORY_CARE_PROVIDER_SITE_OTHER): Payer: Self-pay | Admitting: Internal Medicine

## 2019-11-02 MED ORDER — PANTOPRAZOLE SODIUM 40 MG PO TBEC: 40.0000 mg | DELAYED_RELEASE_TABLET | Freq: Every day | ORAL | 3 refills | Status: DC

## 2019-11-05 ENCOUNTER — Other Ambulatory Visit (INDEPENDENT_AMBULATORY_CARE_PROVIDER_SITE_OTHER): Payer: Self-pay | Admitting: Internal Medicine

## 2019-12-05 ENCOUNTER — Other Ambulatory Visit (INDEPENDENT_AMBULATORY_CARE_PROVIDER_SITE_OTHER): Payer: Self-pay | Admitting: Internal Medicine

## 2019-12-14 ENCOUNTER — Other Ambulatory Visit (INDEPENDENT_AMBULATORY_CARE_PROVIDER_SITE_OTHER): Payer: Self-pay

## 2019-12-14 ENCOUNTER — Other Ambulatory Visit (INDEPENDENT_AMBULATORY_CARE_PROVIDER_SITE_OTHER): Payer: Self-pay | Admitting: Internal Medicine

## 2019-12-14 MED ORDER — THYROID 60 MG PO TABS
120.0000 mg | ORAL_TABLET | Freq: Every day | ORAL | 3 refills | Status: DC
Start: 2019-12-14 — End: 2020-03-01

## 2019-12-14 NOTE — Telephone Encounter (Signed)
Aalani needs a new Rx of NP Thyroid 60mg  two a day sent to [facility]

## 2020-01-12 ENCOUNTER — Encounter (INDEPENDENT_AMBULATORY_CARE_PROVIDER_SITE_OTHER): Payer: 59 | Admitting: Internal Medicine

## 2020-01-27 ENCOUNTER — Ambulatory Visit (INDEPENDENT_AMBULATORY_CARE_PROVIDER_SITE_OTHER): Payer: 59

## 2020-01-27 ENCOUNTER — Ambulatory Visit: Payer: 59 | Admitting: Podiatry

## 2020-01-27 ENCOUNTER — Encounter: Payer: Self-pay | Admitting: Podiatry

## 2020-01-27 ENCOUNTER — Other Ambulatory Visit: Payer: Self-pay

## 2020-01-27 DIAGNOSIS — M722 Plantar fascial fibromatosis: Secondary | ICD-10-CM

## 2020-01-27 NOTE — Progress Notes (Signed)
Carolyn Stone presents today with a chief complaint of arch pain left none is been there for several months is swollen and sensitive it seems to be getting better over the last few days.  She had surgery on her fifth metatarsal left and her fourth toe left by a orthopedist referred from rheumatology at [facility].  States that time she still has burning and pain in this area.  Objective: Vital signs are stable alert oriented x3 no erythema edema cellulitis drainage or odor plantar fibroma medial longitudinal arch left exquisitely tender on palpation.  Radiographs today confirm no calcification within this region it appears that her toes are healing quite nicely.  I see no signs of infection.  And the toe and metatarsals are sitting rectus.  There is no internal fixation.  Assessment: Well-healing surgical foot left from [facility] plantar fibroma left.  Plan: I offered her an injection today she states that she is gone away until it starts to hurt worse and follow-up with me at that time.

## 2020-02-03 ENCOUNTER — Other Ambulatory Visit (INDEPENDENT_AMBULATORY_CARE_PROVIDER_SITE_OTHER): Payer: Self-pay | Admitting: Internal Medicine

## 2020-02-28 ENCOUNTER — Other Ambulatory Visit (INDEPENDENT_AMBULATORY_CARE_PROVIDER_SITE_OTHER): Payer: Self-pay | Admitting: Nurse Practitioner

## 2020-03-01 ENCOUNTER — Ambulatory Visit (INDEPENDENT_AMBULATORY_CARE_PROVIDER_SITE_OTHER): Payer: 59 | Admitting: Internal Medicine

## 2020-03-01 ENCOUNTER — Other Ambulatory Visit: Payer: Self-pay

## 2020-03-01 ENCOUNTER — Encounter (INDEPENDENT_AMBULATORY_CARE_PROVIDER_SITE_OTHER): Payer: Self-pay | Admitting: Internal Medicine

## 2020-03-01 VITALS — BP 130/64 | HR 106 | Temp 97.5°F | Resp 18 | Ht 63.0 in | Wt 194.0 lb

## 2020-03-01 DIAGNOSIS — Z0001 Encounter for general adult medical examination with abnormal findings: Secondary | ICD-10-CM

## 2020-03-01 DIAGNOSIS — I10 Essential (primary) hypertension: Secondary | ICD-10-CM | POA: Diagnosis not present

## 2020-03-01 DIAGNOSIS — E2839 Other primary ovarian failure: Secondary | ICD-10-CM

## 2020-03-01 DIAGNOSIS — E119 Type 2 diabetes mellitus without complications: Secondary | ICD-10-CM

## 2020-03-01 DIAGNOSIS — E559 Vitamin D deficiency, unspecified: Secondary | ICD-10-CM

## 2020-03-01 DIAGNOSIS — E782 Mixed hyperlipidemia: Secondary | ICD-10-CM

## 2020-03-01 NOTE — Progress Notes (Signed)
Chief Complaint: This delightful 17 lady comes in for her annual physical exam. HPI: She is doing reasonably well and has no major complaints at the present time.  Past Medical History:  Diagnosis Date  . Allergy   . Anxiety   . Arthritis   . Dysphagia 12/25/2016  . Myalgia and myositis    "undetermined inflammatory process"   Past Surgical History:  Procedure Laterality Date  . CARPAL TUNNEL RELEASE Bilateral   . CESAREAN SECTION    . DILATION AND CURETTAGE OF UTERUS     x2  . FOOT SURGERY Left      Social History   Social History Narrative   Lives with her husband of 45 years.  Their 2 daughters are grown and live independently.Retired Pharmacist, hospital.    Social History   Tobacco Use  . Smoking status: Never Smoker  . Smokeless tobacco: Never Used  Substance Use Topics  . Alcohol use: No      Allergies:  Allergies  Allergen Reactions  . Bee Venom Other (See Comments) and Swelling  . Nitrofurantoin Other (See Comments)    REACTION: weakness, vomiting, fainting  . Nitrofurantoin Macrocrystal Nausea And Vomiting     Current Meds  Medication Sig  . Cholecalciferol (VITAMIN D3) 125 MCG (5000 UT) TABS Take 2 tablets by mouth daily.  . diclofenac sodium (VOLTAREN) 1 % GEL Apply 2 g topically 4 (four) times daily. Prn pain  . enalapril (VASOTEC) 5 MG tablet Take 1 tablet by mouth twice daily (Patient taking differently: Take 5 mg by mouth daily. )  . estradiol (ESTRACE) 2 MG tablet Take 1 tablet by mouth once daily  . fluticasone (FLONASE) 50 MCG/ACT nasal spray Place 1 spray into both nostrils 2 (two) times daily.  . hydroxychloroquine (PLAQUENIL) 200 MG tablet Take 400 mg by mouth daily.  . montelukast (SINGULAIR) 10 MG tablet Take 1 tablet by mouth once daily  . NP THYROID 120 MG tablet Take 120 mg by mouth every morning.  . pantoprazole (PROTONIX) 40 MG tablet Take 1 tablet by mouth once daily  . progesterone (PROMETRIUM) 200 MG capsule Take 1 capsule by mouth  once daily  . [DISCONTINUED] gabapentin (NEURONTIN) 300 MG capsule TAKE 1 CAPSULE BY MOUTH ONCE DAILY AT BEDTIME  . [DISCONTINUED] thyroid (NP THYROID) 60 MG tablet Take 2 tablets (120 mg total) by mouth daily before breakfast.       Depression screen [facility] 2/9 10/12/2019 08/13/2019  Decreased Interest 0 0  Down, Depressed, Hopeless 0 0  PHQ - 2 Score 0 0     [id number] from the symptoms mentioned above,there are no other symptoms referable to all systems reviewed.       Physical Exam: Blood pressure 130/64, pulse (!) 106, temperature (!) 97.5 F (36.4 C), temperature source Temporal, resp. rate 18, height 5\' 3"  (1.6 m), weight 194 lb (88 kg), SpO2 95 %. Vitals with BMI 03/01/2020 10/12/2019 08/13/2019  Height 5\' 3"  5\' 3"  5\' 3"   Weight 194 lbs 194 lbs 192 lbs 13 oz  BMI 34.37 54.09 81.19  Systolic 147 829 562  Diastolic 64 75 75  Pulse 130 94 84      She looks systemically well.  She remains obese and has not lost any significant weight since last time I saw her.  Blood pressure is well controlled. General: Alert, cooperative, and appears to be the stated age.No pallor.  No jaundice.  No clubbing. Head: Normocephalic Eyes: Sclera white, pupils equal and reactive to  light, red reflex x 2,  Ears: Normal bilaterally Oral cavity: Lips, mucosa, and tongue normal: Teeth and gums normal Neck: No adenopathy, supple, symmetrical, trachea midline, and thyroid does not appear enlarged Respiratory: Clear to auscultation bilaterally.No wheezing, crackles or bronchial breathing. Cardiovascular: Heart sounds are present and appear to be normal without murmurs or added sounds.  No carotid bruits.  Peripheral pulses are present and equal bilaterally.: Gastrointestinal:positive bowel sounds, no hepatosplenomegaly.  No masses felt.No tenderness. Skin: Clear, No rashes noted.No worrisome skin lesions seen. Neurological: Grossly intact without focal findings, cranial nerves II through XII intact,  muscle strength equal bilaterally Musculoskeletal: No acute joint abnormalities noted.Full range of movement noted with joints. Psychiatric: Affect appropriate, non-anxious.    Assessment  1. Encounter for general adult medical examination with abnormal findings   2. Essential hypertension   3. Vitamin D deficiency disease   4. Diabetes mellitus without complication ([place] [facility] [place])   5. Mixed hyperlipidemia   6. Primary ovarian failure     Tests Ordered:   Orders Placed This Encounter  Procedures  . CBC  . COMPLETE METABOLIC PANEL WITH GFR  . Hemoglobin A1c  . T3, free  . TSH  . Progesterone  . Estradiol  . VITAMIN D 25 Hydroxy (Vit-D Deficiency, Fractures)  . Lipid panel     Plan  1. Blood work is ordered. 2. Her medical problems were not discussed today and we will discuss them another time after she has had blood work done. 3. Follow-up in a couple of months.     No orders of the defined types were placed in this encounter.    Trinton Prewitt C Advith Martine   03/01/2020, 4:07 PM

## 2020-03-06 ENCOUNTER — Other Ambulatory Visit (INDEPENDENT_AMBULATORY_CARE_PROVIDER_SITE_OTHER): Payer: Self-pay | Admitting: Nurse Practitioner

## 2020-03-14 ENCOUNTER — Other Ambulatory Visit (INDEPENDENT_AMBULATORY_CARE_PROVIDER_SITE_OTHER): Payer: Self-pay | Admitting: Internal Medicine

## 2020-03-14 ENCOUNTER — Telehealth (INDEPENDENT_AMBULATORY_CARE_PROVIDER_SITE_OTHER): Payer: Self-pay

## 2020-03-14 MED ORDER — ESTRADIOL 2 MG PO TABS: 2.0000 mg | ORAL_TABLET | Freq: Every day | ORAL | 0 refills | Status: DC
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
## 2020-03-14 NOTE — Telephone Encounter (Signed)
Okay, I have just sent that prescription to the [facility] in [place].  Thanks.

## 2020-03-15 NOTE — Telephone Encounter (Signed)
Called patient and let her know that her Rx has been sent to the pharmacy. Patient verbalized an understanding.

## 2020-03-23 ENCOUNTER — Ambulatory Visit (INDEPENDENT_AMBULATORY_CARE_PROVIDER_SITE_OTHER): Payer: 59 | Admitting: Internal Medicine

## 2020-03-23 ENCOUNTER — Other Ambulatory Visit: Payer: Self-pay

## 2020-03-23 ENCOUNTER — Encounter (INDEPENDENT_AMBULATORY_CARE_PROVIDER_SITE_OTHER): Payer: Self-pay | Admitting: Internal Medicine

## 2020-03-23 VITALS — BP 142/82 | HR 90 | Temp 97.7°F | Ht 63.0 in | Wt 193.2 lb

## 2020-03-23 DIAGNOSIS — R6889 Other general symptoms and signs: Secondary | ICD-10-CM | POA: Diagnosis not present

## 2020-03-23 MED ORDER — NEOMYCIN-POLYMYXIN-HC 3.5-10000-1 OP SUSP: 3.0000 [drp] | Freq: Four times a day (QID) | OPHTHALMIC | 0 refills | Status: DC

## 2020-03-23 NOTE — Progress Notes (Signed)
Metrics: Intervention Frequency ACO  Documented Smoking Status Yearly  Screened one or more times in 24 months  Cessation Counseling or  Active cessation medication Past 24 months  Past 24 months   Guideline developer: UpToDate (See UpToDate for funding source) Date Released: 2014       Wellness Office Visit  Subjective:  Patient ID: Carolyn Stone, female    DOB: 1956/02/24  Age: 64 y.o. MRN: [id number]  CC: Itchy eyes HPI  This lady usually suffers fromallergies around this time of the year with sinus congestion, itchy eyes and some postnasal drainage.  Her main symptom today appears to be itchy eyes.  She is taking allergy medicine over-the-counter.  She has taken eyedrops for dry eyes but this has not helped as well as she would like. Past Medical History:  Diagnosis Date  . Allergy   . Anxiety   . Arthritis   . Dysphagia 12/25/2016  . Myalgia and myositis    "undetermined inflammatory process"   Past Surgical History:  Procedure Laterality Date  . CARPAL TUNNEL RELEASE Bilateral   . CESAREAN SECTION    . DILATION AND CURETTAGE OF UTERUS     x2  . FOOT SURGERY Left      Family History  Problem Relation Age of Onset  . Cancer Mother        squamous cell-larygopharyngeal-died age 64  . Cancer Father        lung/liver cancer- died age 64  . Mental illness Father        ?schizophrenia  . Hypertension Brother   . Mental illness Sister        Bipolar Disorder  . Cancer Maternal Grandmother   . Hypertension Paternal Grandmother   . Hyperlipidemia Paternal Grandmother   . Stroke Paternal Grandfather     Social History   Social History Narrative   Lives with her husband of 27 years.  Their 2 daughters are grown and live independently.Retired Pharmacist, hospital.   Social History   Tobacco Use  . Smoking status: Never Smoker  . Smokeless tobacco: Never Used  Substance Use Topics  . Alcohol use: No    Current Meds  Medication Sig  . Acetaminophen (TYLENOL  ARTHRITIS PAIN PO) Tylenol Arthritis Pain  . Cholecalciferol (VITAMIN D3) 125 MCG (5000 UT) TABS Take 2 tablets by mouth daily.  . clobetasol cream (TEMOVATE) 0.05 % clobetasol 0.05 % topical cream  APPLY CREAM TOPICALLY TWICE DAILY TO RED ITCHY AREAS OF BODY FOR 2 4 WEEKS  . diclofenac sodium (VOLTAREN) 1 % GEL Apply 2 g topically 4 (four) times daily. Prn pain  . enalapril (VASOTEC) 5 MG tablet Take 1 tablet by mouth twice daily (Patient taking differently: Take 5 mg by mouth daily. )  . estradiol (ESTRACE) 2 MG tablet Take 1 tablet (2 mg total) by mouth daily.  . fluconazole (DIFLUCAN) 150 MG tablet fluconazole 150 mg tablet  TAKE 1 TABLET BY MOUTH ONCE DAILY FOR 2 DAYS  . fluticasone (FLONASE) 50 MCG/ACT nasal spray Place 1 spray into both nostrils 2 (two) times daily.  Marland Kitchen gabapentin (NEURONTIN) 300 MG capsule gabapentin 300 mg capsule  TAKE 1 CAPSULE BY MOUTH ONCE DAILY AT BEDTIME  . hydroxychloroquine (PLAQUENIL) 200 MG tablet Take 400 mg by mouth daily.  Marland Kitchen Lifitegrast (XIIDRA) 5 % SOLN Xiidra 5 % eye drops in a dropperette  . montelukast (SINGULAIR) 10 MG tablet Take 1 tablet by mouth once daily  . pantoprazole (PROTONIX) 40 MG tablet Take 1  tablet by mouth once daily  . progesterone (PROMETRIUM) 200 MG capsule Take 1 capsule by mouth once daily  . thyroid (NP THYROID) 60 MG tablet NP Thyroid 60 mg tablet  TAKE 2 TABLETS BY MOUTH ONCE DAILY BEFORE BREAKFAST      Depression screen [facility] 2/9 10/12/2019 08/13/2019  Decreased Interest 0 0  Down, Depressed, Hopeless 0 0  PHQ - 2 Score 0 0     Objective:   Today's Vitals: BP (!) 142/82   Pulse 90   Temp 97.7 F (36.5 C) (Temporal)   Ht 5\' 3"  (1.6 m)   Wt 193 lb 3.2 oz (87.6 kg)   SpO2 98%   BMI 34.22 kg/m  Vitals with BMI 03/23/2020 03/01/2020 10/12/2019  Height 5\' 3"  5\' 3"  5\' 3"   Weight 193 lbs 3 oz 194 lbs 194 lbs  BMI 34.23 47.65 46.50  Systolic 354 656 812  Diastolic 82 64 75  Pulse 90 106 94     Physical Exam She  looks systemically well.  Her eyes do not look severely inflamed.      Assessment   1. Itchy eyes       Tests ordered No orders of the defined types were placed in this encounter.    Plan: 1. I will treat her allergic symptoms with Corticosporin eyedrops to see if this will help her.  If it does not, she will let me know. 2. Follow-up as scheduled.   Meds ordered this encounter  Medications  . neomycin-polymyxin-hydrocortisone (CORTISPORIN) 3.5-10000-1 ophthalmic suspension    Sig: Place 3 drops into both eyes 4 (four) times daily.    Dispense:  7.5 mL    Refill:  0    Shaquera Ansley Luther Parody, MD

## 2020-03-31 ENCOUNTER — Other Ambulatory Visit (INDEPENDENT_AMBULATORY_CARE_PROVIDER_SITE_OTHER): Payer: Self-pay | Admitting: Internal Medicine

## 2020-05-04 ENCOUNTER — Ambulatory Visit (INDEPENDENT_AMBULATORY_CARE_PROVIDER_SITE_OTHER): Payer: 59 | Admitting: Internal Medicine

## 2020-05-04 ENCOUNTER — Other Ambulatory Visit: Payer: Self-pay

## 2020-05-04 ENCOUNTER — Encounter (INDEPENDENT_AMBULATORY_CARE_PROVIDER_SITE_OTHER): Payer: Self-pay | Admitting: Internal Medicine

## 2020-05-04 VITALS — BP 136/80 | HR 97 | Temp 97.6°F | Resp 18 | Ht 63.0 in | Wt 192.4 lb

## 2020-05-04 DIAGNOSIS — M359 Systemic involvement of connective tissue, unspecified: Secondary | ICD-10-CM

## 2020-05-04 DIAGNOSIS — E2839 Other primary ovarian failure: Secondary | ICD-10-CM

## 2020-05-04 DIAGNOSIS — I10 Essential (primary) hypertension: Secondary | ICD-10-CM

## 2020-05-04 DIAGNOSIS — E782 Mixed hyperlipidemia: Secondary | ICD-10-CM

## 2020-05-04 NOTE — Progress Notes (Signed)
Metrics: Intervention Frequency ACO  Documented Smoking Status Yearly  Screened one or more times in 24 months  Cessation Counseling or  Active cessation medication Past 24 months  Past 24 months   Guideline developer: UpToDate (See UpToDate for funding source) Date Released: 2014       Wellness Office Visit  Subjective:  Patient ID: Carolyn Stone, female    DOB: June 22, 1955  Age: 64 y.o. MRN: [id number]  CC: This lady comes in for follow-up of hypertension, bioidentical hormone therapy, hyperlipidemia, connective tissue disease. HPI  She is doing reasonably well.  Unfortunately the last time I saw her for a physical, she did not get blood work done. She has not taken her estradiol this morning either. She continues on estradiol and progesterone as well as NP thyroid for her symptoms of thyroid hypofunction. She has connective tissue disorder.  She is taking immunosuppressive therapy. Past Medical History:  Diagnosis Date  . Allergy   . Anxiety   . Arthritis   . Connective tissue disorder ([place])   . Dysphagia 12/25/2016  . Myalgia and myositis    "undetermined inflammatory process"   Past Surgical History:  Procedure Laterality Date  . CARPAL TUNNEL RELEASE Bilateral   . CESAREAN SECTION    . DILATION AND CURETTAGE OF UTERUS     x2  . FOOT SURGERY Left      Family History  Problem Relation Age of Onset  . Cancer Mother        squamous cell-larygopharyngeal-died age 77  . Cancer Father        lung/liver cancer- died age 10  . Mental illness Father        ?schizophrenia  . Hypertension Brother   . Mental illness Sister        Bipolar Disorder  . Cancer Maternal Grandmother   . Hypertension Paternal Grandmother   . Hyperlipidemia Paternal Grandmother   . Stroke Paternal Grandfather     Social History   Social History Narrative   Lives with her husband of 65 years.  Their 2 daughters are grown and live independently.Retired Pharmacist, hospital.   Social History    Tobacco Use  . Smoking status: Never Smoker  . Smokeless tobacco: Never Used  Substance Use Topics  . Alcohol use: No    Current Meds  Medication Sig  . Acetaminophen (TYLENOL ARTHRITIS PAIN PO) Tylenol Arthritis Pain  . Cholecalciferol (VITAMIN D3) 125 MCG (5000 UT) TABS Take 2 tablets by mouth daily.  . clobetasol cream (TEMOVATE) 0.05 % clobetasol 0.05 % topical cream  APPLY CREAM TOPICALLY TWICE DAILY TO RED ITCHY AREAS OF BODY FOR 2 4 WEEKS  . diclofenac sodium (VOLTAREN) 1 % GEL Apply 2 g topically 4 (four) times daily. Prn pain  . estradiol (ESTRACE) 2 MG tablet Take 1 tablet (2 mg total) by mouth daily.  . fluconazole (DIFLUCAN) 150 MG tablet fluconazole 150 mg tablet  TAKE 1 TABLET BY MOUTH ONCE DAILY FOR 2 DAYS  . fluticasone (FLONASE) 50 MCG/ACT nasal spray Place 1 spray into both nostrils 2 (two) times daily.  Marland Kitchen gabapentin (NEURONTIN) 300 MG capsule gabapentin 300 mg capsule  TAKE 1 CAPSULE BY MOUTH ONCE DAILY AT BEDTIME  . hydroxychloroquine (PLAQUENIL) 200 MG tablet Take 400 mg by mouth daily.  Marland Kitchen Lifitegrast (XIIDRA) 5 % SOLN Xiidra 5 % eye drops in a dropperette  . montelukast (SINGULAIR) 10 MG tablet Take 1 tablet by mouth once daily  . neomycin-polymyxin-hydrocortisone (CORTISPORIN) 3.5-10000-1 ophthalmic suspension Place 3  drops into both eyes 4 (four) times daily.  . NP THYROID 120 MG tablet Take 120 mg by mouth every morning.   . pantoprazole (PROTONIX) 40 MG tablet Take 1 tablet by mouth once daily  . progesterone (PROMETRIUM) 200 MG capsule Take 1 capsule by mouth once daily  . [DISCONTINUED] enalapril (VASOTEC) 5 MG tablet TAKE ONE TABLET BY MOUTH ONCE DAILY (Patient taking differently: twice a day)  . [DISCONTINUED] enalapril (VASOTEC) 5 MG tablet Take 1 tablet by mouth twice daily (Patient taking differently: Take 5 mg by mouth daily. )  . [DISCONTINUED] thyroid (NP THYROID) 60 MG tablet NP Thyroid 60 mg tablet  TAKE 2 TABLETS BY MOUTH ONCE DAILY BEFORE  BREAKFAST      Depression screen [facility] 2/9 10/12/2019 08/13/2019  Decreased Interest 0 0  Down, Depressed, Hopeless 0 0  PHQ - 2 Score 0 0     Objective:   Today's Vitals: BP 136/80 (BP Location: Right Arm, Patient Position: Sitting, Cuff Size: Normal)   Pulse 97   Temp 97.6 F (36.4 C) (Temporal)   Resp 18   Ht 5\' 3"  (1.6 m)   Wt 192 lb 6.4 oz (87.3 kg)   SpO2 98%   BMI 34.08 kg/m  Vitals with BMI 05/04/2020 03/23/2020 03/01/2020  Height 5\' 3"  5\' 3"  5\' 3"   Weight 192 lbs 6 oz 193 lbs 3 oz 194 lbs  BMI 34.09 09.32 67.12  Systolic 458 099 833  Diastolic 80 82 64  Pulse 97 90 106     Physical Exam She looks systemically well.  She is obese.  Weight is unchanged.  Blood pressure is better than last time.      Assessment   1. Essential hypertension   2. Primary ovarian failure   3. Mixed hyperlipidemia   4. Connective tissue disorder ([place])       Tests ordered Orders Placed This Encounter  Procedures  . Progesterone  . Estradiol  . T3, free  . TSH     Plan: 1. She will continue with enalapril at a lower dose for hypertension. 2. She will continue with estradiol and progesterone at current doses and she will come back next week to have these levels checked. 3. She will continue with desiccated NP thyroid and she will come back next week to have the levels checked. 4. I did recommend DHEA 15 mg daily from [facility] to see if this will help her symptoms of connective tissue disease as DHEA seems to be helpful in the spectrum of disorders based on the medical literature. 5. Follow-up in 3 months and further recommendations will depend on blood results.   No orders of the defined types were placed in this encounter.   Doree Albee, MD

## 2020-05-13 ENCOUNTER — Other Ambulatory Visit (INDEPENDENT_AMBULATORY_CARE_PROVIDER_SITE_OTHER): Payer: Self-pay | Admitting: Internal Medicine

## 2020-05-16 ENCOUNTER — Other Ambulatory Visit (INDEPENDENT_AMBULATORY_CARE_PROVIDER_SITE_OTHER): Payer: Self-pay | Admitting: Internal Medicine

## 2020-05-16 ENCOUNTER — Telehealth (INDEPENDENT_AMBULATORY_CARE_PROVIDER_SITE_OTHER): Payer: Self-pay

## 2020-05-16 MED ORDER — ESTRADIOL 2 MG PO TABS: 2.0000 mg | ORAL_TABLET | Freq: Every day | ORAL | 0 refills | Status: DC

## 2020-05-16 NOTE — Telephone Encounter (Signed)
Received a fax from [facility] requesting refills for the following medication:  estradiol (ESTRACE) 2 MG tablet  Last filled 03/14/2020, # 90 with 0 refills  Last OV 05/04/2020  Next OV 08/03/2020

## 2020-06-09 ENCOUNTER — Other Ambulatory Visit (INDEPENDENT_AMBULATORY_CARE_PROVIDER_SITE_OTHER): Payer: Self-pay | Admitting: Internal Medicine

## 2020-08-01 ENCOUNTER — Other Ambulatory Visit (INDEPENDENT_AMBULATORY_CARE_PROVIDER_SITE_OTHER): Payer: 59

## 2020-08-03 ENCOUNTER — Other Ambulatory Visit: Payer: Self-pay

## 2020-08-03 ENCOUNTER — Ambulatory Visit (INDEPENDENT_AMBULATORY_CARE_PROVIDER_SITE_OTHER): Payer: 59 | Admitting: Internal Medicine

## 2020-08-03 ENCOUNTER — Encounter (INDEPENDENT_AMBULATORY_CARE_PROVIDER_SITE_OTHER): Payer: Self-pay | Admitting: Internal Medicine

## 2020-08-03 ENCOUNTER — Other Ambulatory Visit (INDEPENDENT_AMBULATORY_CARE_PROVIDER_SITE_OTHER): Payer: Self-pay

## 2020-08-03 VITALS — BP 133/80 | HR 83 | Temp 97.3°F | Resp 18 | Ht 63.0 in | Wt 191.8 lb

## 2020-08-03 DIAGNOSIS — E782 Mixed hyperlipidemia: Secondary | ICD-10-CM

## 2020-08-03 DIAGNOSIS — E2839 Other primary ovarian failure: Secondary | ICD-10-CM | POA: Diagnosis not present

## 2020-08-03 DIAGNOSIS — I10 Essential (primary) hypertension: Secondary | ICD-10-CM | POA: Diagnosis not present

## 2020-08-03 DIAGNOSIS — E119 Type 2 diabetes mellitus without complications: Secondary | ICD-10-CM | POA: Diagnosis not present

## 2020-08-03 NOTE — Progress Notes (Signed)
Metrics: Intervention Frequency ACO  Documented Smoking Status Yearly  Screened one or more times in 24 months  Cessation Counseling or  Active cessation medication Past 24 months  Past 24 months   Guideline developer: UpToDate (See UpToDate for funding source) Date Released: 2014       Wellness Office Visit  Subjective:  Patient ID: Carolyn Stone, female    DOB: [birth year]  Age: 65 y.o. MRN: [id number]  CC: This lady comes in for follow-up of hypertension, bioidentical hormone therapy, dyslipidemia and diabetes. HPI  Unfortunately, she did not get any blood work done in December of last year as things got busy with her daughter. She did not try DHEA as recommended also. She has cut back on the estradiol and progesterone to every other day on the advice of her GYN because she was getting some side effects.  She does seem to be feel better on this regimen of every other day for right now. Past Medical History:  Diagnosis Date  . Allergy   . Anxiety   . Arthritis   . Connective tissue disorder ([place])   . Dysphagia 12/25/2016  . Myalgia and myositis    "undetermined inflammatory process"   Past Surgical History:  Procedure Laterality Date  . CARPAL TUNNEL RELEASE Bilateral   . CESAREAN SECTION    . DILATION AND CURETTAGE OF UTERUS     x2  . FOOT SURGERY Left      Family History  Problem Relation Age of Onset  . Cancer Mother        squamous cell-larygopharyngeal-died age 46  . Cancer Father        lung/liver cancer- died age 77  . Mental illness Father        ?schizophrenia  . Hypertension Brother   . Mental illness Sister        Bipolar Disorder  . Cancer Maternal Grandmother   . Hypertension Paternal Grandmother   . Hyperlipidemia Paternal Grandmother   . Stroke Paternal Grandfather     Social History   Social History Narrative   Lives with her husband of 95 years.  Their 2 daughters are grown and live independently.Retired Pharmacist, hospital.   Social History    Tobacco Use  . Smoking status: Never Smoker  . Smokeless tobacco: Never Used  Substance Use Topics  . Alcohol use: No    Current Meds  Medication Sig  . Acetaminophen (TYLENOL ARTHRITIS PAIN PO) Tylenol Arthritis Pain  . Cholecalciferol (VITAMIN D3) 125 MCG (5000 UT) TABS Take 2 tablets by mouth daily.  . clobetasol cream (TEMOVATE) 0.05 % clobetasol 0.05 % topical cream  APPLY CREAM TOPICALLY TWICE DAILY TO RED ITCHY AREAS OF BODY FOR 2 4 WEEKS  . diclofenac sodium (VOLTAREN) 1 % GEL Apply 2 g topically 4 (four) times daily. Prn pain  . estradiol (ESTRACE) 2 MG tablet Take 1 tablet (2 mg total) by mouth daily. (Patient taking differently: Take 2 mg by mouth every other day.)  . fluticasone (FLONASE) 50 MCG/ACT nasal spray Place 1 spray into both nostrils 2 (two) times daily.  . hydroxychloroquine (PLAQUENIL) 200 MG tablet Take 400 mg by mouth daily.  Marland Kitchen Lifitegrast (XIIDRA) 5 % SOLN Xiidra 5 % eye drops in a dropperette  . montelukast (SINGULAIR) 10 MG tablet Take 1 tablet by mouth once daily  . NP THYROID 120 MG tablet Take 1 tablet by mouth once daily  . pantoprazole (PROTONIX) 40 MG tablet Take 1 tablet by mouth once daily  .  progesterone (PROMETRIUM) 200 MG capsule Take 1 capsule by mouth once daily (Patient taking differently: Take 200 mg by mouth every other day.)  . [DISCONTINUED] fluconazole (DIFLUCAN) 150 MG tablet fluconazole 150 mg tablet  TAKE 1 TABLET BY MOUTH ONCE DAILY FOR 2 DAYS  . [DISCONTINUED] neomycin-polymyxin-hydrocortisone (CORTISPORIN) 3.5-10000-1 ophthalmic suspension Place 3 drops into both eyes 4 (four) times daily.       Objective:   Today's Vitals: BP 133/80 (BP Location: Right Arm, Patient Position: Sitting, Cuff Size: Normal)   Pulse 83   Temp (!) 97.3 F (36.3 C) (Temporal)   Resp 18   Ht 5\' 3"  (1.6 m)   Wt 191 lb 12.8 oz (87 kg)   SpO2 98%   BMI 33.98 kg/m  Vitals with BMI 08/03/2020 05/04/2020 03/23/2020  Height 5\' 3"  5\' 3"  5\' 3"   Weight  191 lbs 13 oz 192 lbs 6 oz 193 lbs 3 oz  BMI 33.98 09.81 19.14  Systolic 782 956 213  Diastolic 80 80 82  Pulse 83 97 90     Physical Exam   She looks systemically well.  Her weight is stable.  She remains obese.  Blood pressure is acceptable.  Microfilament testing of her foot does not reveal any evidence of peripheral neuropathy.    Assessment   1. Essential hypertension   2. Primary ovarian failure   3. Mixed hyperlipidemia   4. Diabetes mellitus without complication ([place])       Tests ordered Orders Placed This Encounter  Procedures  . Microalbumin, urine  . DHEA-sulfate  . Estradiol  . Progesterone  . T3, free  . T4, free  . TSH  . Hemoglobin A1c  . COMPLETE METABOLIC PANEL WITH GFR  . Lipid panel  . CBC     Plan: 1. She will continue with patent hormone therapy and we will check levels today. 2. She will continue with thyroid, which is being used off label, for symptoms of thyroid hypofunction.  We will check levels today. 3. We will check her hemoglobin A1c today also. 4. Further recommendations will depend on all these results and I will see her in 3 months time for follow-up.   No orders of the defined types were placed in this encounter.   Doree Albee, MD

## 2020-08-04 ENCOUNTER — Other Ambulatory Visit (INDEPENDENT_AMBULATORY_CARE_PROVIDER_SITE_OTHER): Payer: Self-pay | Admitting: Internal Medicine

## 2020-08-04 LAB — COMPLETE METABOLIC PANEL WITH GFR
AG Ratio: 1.4 (calc) (ref 1.0–2.5)
ALT: 20 U/L (ref 6–29)
AST: 20 U/L (ref 10–35)
Albumin: 4.1 g/dL (ref 3.6–5.1)
Alkaline phosphatase (APISO): 23 U/L — ABNORMAL LOW (ref 37–153)
BUN: 10 mg/dL (ref 7–25)
CO2: 27 mmol/L (ref 20–32)
Calcium: 9.4 mg/dL (ref 8.6–10.4)
Chloride: 103 mmol/L (ref 98–110)
Creat: 0.71 mg/dL (ref 0.50–0.99)
GFR, Est African American: 104 mL/min/{1.73_m2} (ref >=60)
GFR, Est Non African American: 90 mL/min/{1.73_m2} (ref >=60)
Globulin: 2.9 g/dL (calc) (ref 1.9–3.7)
Glucose, Bld: 75 mg/dL (ref 65–139)
Potassium: 4.4 mmol/L (ref 3.5–5.3)
Sodium: 140 mmol/L (ref 135–146)
Total Bilirubin: 0.6 mg/dL (ref 0.2–1.2)
Total Protein: 7 g/dL (ref 6.1–8.1)

## 2020-08-04 LAB — CBC
HCT: 41.8 % (ref 35.0–45.0)
Hemoglobin: 14.2 g/dL (ref 11.7–15.5)
MCH: 31 pg (ref 27.0–33.0)
MCHC: 34 g/dL (ref 32.0–36.0)
MCV: 91.3 fL (ref 80.0–100.0)
MPV: 9.7 fL (ref 7.5–12.5)
Platelets: 284 10*3/uL (ref 140–400)
RBC: 4.58 10*6/uL (ref 3.80–5.10)
RDW: 11.9 % (ref 11.0–15.0)
WBC: 7 10*3/uL (ref 3.8–10.8)

## 2020-08-04 LAB — T4, FREE: Free T4: 1 ng/dL (ref 0.8–1.8)

## 2020-08-04 LAB — HEMOGLOBIN A1C
Hgb A1c MFr Bld: 5.4 % of total Hgb (ref <5.7)
Mean Plasma Glucose: 108 mg/dL
eAG (mmol/L): 6 mmol/L

## 2020-08-04 LAB — LIPID PANEL
Cholesterol: 186 mg/dL (ref <200)
HDL: 47 mg/dL — ABNORMAL LOW (ref >=50)
LDL Cholesterol (Calc): 113 mg/dL (calc) — ABNORMAL HIGH
Non-HDL Cholesterol (Calc): 139 mg/dL (calc) — ABNORMAL HIGH (ref <130)
Total CHOL/HDL Ratio: 4 (calc) (ref <5.0)
Triglycerides: 143 mg/dL (ref <150)

## 2020-08-04 LAB — TSH: TSH: 0.43 mIU/L (ref 0.40–4.50)

## 2020-08-04 LAB — MICROALBUMIN, URINE: Microalb, Ur: 0.9 mg/dL

## 2020-08-04 LAB — T3, FREE: T3, Free: 2.8 pg/mL (ref 2.3–4.2)

## 2020-08-04 LAB — ESTRADIOL: Estradiol: 72 pg/mL

## 2020-08-04 LAB — PROGESTERONE: Progesterone: 64.5 ng/mL

## 2020-08-04 LAB — DHEA-SULFATE: DHEA-SO4: 66 ug/dL (ref 9–118)

## 2020-08-04 MED ORDER — NP THYROID 30 MG PO TABS: 30.0000 mg | ORAL_TABLET | Freq: Every day | ORAL | 3 refills | Status: DC

## 2020-08-09 NOTE — Progress Notes (Signed)
Please call the patient and read to her what I wrote in my chart regarding her blood work.  Thanks.

## 2020-08-23 ENCOUNTER — Other Ambulatory Visit (INDEPENDENT_AMBULATORY_CARE_PROVIDER_SITE_OTHER): Payer: Self-pay | Admitting: Internal Medicine

## 2020-08-30 ENCOUNTER — Other Ambulatory Visit (INDEPENDENT_AMBULATORY_CARE_PROVIDER_SITE_OTHER): Payer: Self-pay | Admitting: Internal Medicine

## 2020-08-30 ENCOUNTER — Telehealth (INDEPENDENT_AMBULATORY_CARE_PROVIDER_SITE_OTHER): Payer: Self-pay

## 2020-08-30 MED ORDER — ENALAPRIL MALEATE 5 MG PO TABS: 5.0000 mg | ORAL_TABLET | Freq: Every day | ORAL | 1 refills | Status: DC

## 2020-08-30 NOTE — Telephone Encounter (Signed)
Patient called and stated that she has been taking this medication once a day and never stopped this medication. Patient stated that she does not remember talking about going off of this medication. Patient stated that her BP has been a little elevated a times and not too elevated.  enalapril (VASOTEC) 5 MG tablet   Patient needs a refill sent to [facility] to take 1 tablet daily.  Please let her know if she needs to stop taking this medication.

## 2020-08-30 NOTE — Telephone Encounter (Signed)
Okay, I have sent this to [facility].

## 2020-08-31 NOTE — Telephone Encounter (Signed)
Called patient and let her know that the prescription has been sent to [facility]. Patient verbalized an understanding.

## 2020-10-04 LAB — EXTERNAL GENERIC LAB PROCEDURE: COLOGUARD: NEGATIVE

## 2020-10-04 LAB — COLOGUARD: COLOGUARD: NEGATIVE

## 2020-10-05 ENCOUNTER — Other Ambulatory Visit (INDEPENDENT_AMBULATORY_CARE_PROVIDER_SITE_OTHER): Payer: Self-pay | Admitting: Internal Medicine

## 2020-10-27 ENCOUNTER — Other Ambulatory Visit: Payer: Self-pay

## 2020-10-27 ENCOUNTER — Ambulatory Visit (INDEPENDENT_AMBULATORY_CARE_PROVIDER_SITE_OTHER): Payer: Medicare Other | Admitting: Internal Medicine

## 2020-10-27 ENCOUNTER — Encounter (INDEPENDENT_AMBULATORY_CARE_PROVIDER_SITE_OTHER): Payer: Self-pay | Admitting: Internal Medicine

## 2020-10-27 VITALS — BP 132/80 | HR 89 | Temp 98.4°F | Resp 18 | Ht 63.0 in | Wt 196.3 lb

## 2020-10-27 DIAGNOSIS — I10 Essential (primary) hypertension: Secondary | ICD-10-CM | POA: Diagnosis not present

## 2020-10-27 DIAGNOSIS — E782 Mixed hyperlipidemia: Secondary | ICD-10-CM

## 2020-10-27 DIAGNOSIS — R3915 Urgency of urination: Secondary | ICD-10-CM

## 2020-10-27 MED ORDER — OXYBUTYNIN CHLORIDE 5 MG PO TABS: 5.0000 mg | ORAL_TABLET | Freq: Three times a day (TID) | ORAL | 3 refills | Status: DC

## 2020-10-27 NOTE — Progress Notes (Signed)
Metrics: Intervention Frequency ACO  Documented Smoking Status Yearly  Screened one or more times in 24 months  Cessation Counseling or  Active cessation medication Past 24 months  Past 24 months   Guideline developer: UpToDate (See UpToDate for funding source) Date Released: 2014       Wellness Office Visit  Subjective:  Patient ID: Carolyn Stone, female    DOB: 02-01-1956  Age: 65 y.o. MRN: [id number]  CC: Urinary urgency HPI  This lady coming for an acute visit with the above symptoms which she has had for the last 3 months intermittently.  We decided to also make this a follow-up visit as she is due to come in in the next couple of weeks anyway. She denies any dysuria with the urinary urgency and there is no incontinence.  She denies any nausea vomiting or abdominal pain. She decided to stop taking estradiol and progesterone because of what she had heard from other people including physicians regarding the risk of breast cancer. She continues to take NP thyroid 120 mg in the morning and sometimes remembers to take NP thyroid 30 mg in the afternoon usually 5/7 days. Past Medical History:  Diagnosis Date  . Allergy   . Anxiety   . Arthritis   . Connective tissue disorder ([place])   . Dysphagia 12/25/2016  . Myalgia and myositis    "undetermined inflammatory process"   Past Surgical History:  Procedure Laterality Date  . CARPAL TUNNEL RELEASE Bilateral   . CESAREAN SECTION    . DILATION AND CURETTAGE OF UTERUS     x2  . FOOT SURGERY Left      Family History  Problem Relation Age of Onset  . Cancer Mother        squamous cell-larygopharyngeal-died age 57  . Cancer Father        lung/liver cancer- died age 48  . Mental illness Father        ?schizophrenia  . Hypertension Brother   . Mental illness Sister        Bipolar Disorder  . Cancer Maternal Grandmother   . Hypertension Paternal Grandmother   . Hyperlipidemia Paternal Grandmother   . Stroke Paternal  Grandfather     Social History   Social History Narrative   Lives with her husband of 64 years.  Their 2 daughters are grown and live independently.Retired Pharmacist, hospital.   Social History   Tobacco Use  . Smoking status: Never Smoker  . Smokeless tobacco: Never Used  Substance Use Topics  . Alcohol use: No    Current Meds  Medication Sig  . Acetaminophen (TYLENOL ARTHRITIS PAIN PO) Tylenol Arthritis Pain  . Cholecalciferol (VITAMIN D3) 125 MCG (5000 UT) TABS Take 2 tablets by mouth daily.  . clobetasol cream (TEMOVATE) 0.05 % clobetasol 0.05 % topical cream  APPLY CREAM TOPICALLY TWICE DAILY TO RED ITCHY AREAS OF BODY FOR 2 4 WEEKS  . cyclobenzaprine (FLEXERIL) 5 MG tablet Take 5 mg by mouth at bedtime as needed.  . diclofenac sodium (VOLTAREN) 1 % GEL Apply 2 g topically 4 (four) times daily. Prn pain  . enalapril (VASOTEC) 5 MG tablet Take 1 tablet (5 mg total) by mouth daily.  . fluticasone (FLONASE) 50 MCG/ACT nasal spray Place 1 spray into both nostrils 2 (two) times daily.  . hydroxychloroquine (PLAQUENIL) 200 MG tablet Take 400 mg by mouth daily.  Marland Kitchen Lifitegrast (XIIDRA) 5 % SOLN Xiidra 5 % eye drops in a dropperette  . montelukast (SINGULAIR) 10  MG tablet Take 1 tablet by mouth once daily  . NP THYROID 120 MG tablet Take 1 tablet (120 mg total) by mouth daily before breakfast.  . NP THYROID 30 MG tablet Take 1 tablet (30 mg total) by mouth daily before lunch.  . oxybutynin (DITROPAN) 5 MG tablet Take 1 tablet (5 mg total) by mouth 3 (three) times daily.  . pantoprazole (PROTONIX) 40 MG tablet Take 1 tablet by mouth once daily  . [DISCONTINUED] estradiol (ESTRACE) 2 MG tablet Take 1 tablet (2 mg total) by mouth daily. (Patient taking differently: Take 2 mg by mouth every other day.)  . [DISCONTINUED] progesterone (PROMETRIUM) 200 MG capsule Take 1 capsule by mouth once daily (Patient taking differently: Take 200 mg by mouth every other day.)       Objective:   Today's  Vitals: BP 132/80 (BP Location: Left Arm, Patient Position: Sitting, Cuff Size: Normal)   Pulse 89   Temp 98.4 F (36.9 C) (Temporal)   Resp 18   Ht 5\' 3"  (1.6 m)   Wt 196 lb 4.8 oz (89 kg)   SpO2 99%   BMI 34.77 kg/m  Vitals with BMI 10/27/2020 08/03/2020 05/04/2020  Height 5\' 3"  5\' 3"  5\' 3"   Weight 196 lbs 5 oz 191 lbs 13 oz 192 lbs 6 oz  BMI 34.78 26.83 41.96  Systolic 222 979 892  Diastolic 80 80 80  Pulse 89 83 97     Physical Exam She has gained 5 pounds in weight.  Blood pressure is acceptable.      Assessment   1. Urinary urgency   2. Essential hypertension   3. Mixed hyperlipidemia       Tests ordered Orders Placed This Encounter  Procedures  . Urinalysis w microscopic + reflex cultur  . COMPLETE METABOLIC PANEL WITH GFR  . T3, free  . TSH     Plan: 1. We will try her on oxybutynin for urinary urgency and I will send the urine for urinalysis and possible culture.  If the oxybutynin helps then she will continue but if it does not, she will let me know and I will refer her to urology. 2. I explained to her again the difference between bioidentical estrogens such as estradiol and progestogen such as progesterone versus synthetic hormones.  I explained the benefits and reduction in risk of breast cancer with progesterone.  She will think about it and decide whether she wants to go back on BHRT or not. 3. Repeat thyroid function test today. 4. I will see her in October for an annual physical exam   Meds ordered this encounter  Medications  . oxybutynin (DITROPAN) 5 MG tablet    Sig: Take 1 tablet (5 mg total) by mouth 3 (three) times daily.    Dispense:  90 tablet    Refill:  3    Carolyn Castell Luther Parody, MD

## 2020-10-27 NOTE — Progress Notes (Signed)
Pt is going to give a u/a sample now.  Going since last appt. Been worse the last 3wks. Back pain.

## 2020-10-28 LAB — URINALYSIS W MICROSCOPIC + REFLEX CULTURE
Bacteria, UA: NONE SEEN /HPF
Bilirubin Urine: NEGATIVE
Glucose, UA: NEGATIVE
Hgb urine dipstick: NEGATIVE
Hyaline Cast: NONE SEEN /LPF
Ketones, ur: NEGATIVE
Leukocyte Esterase: NEGATIVE
Nitrites, Initial: NEGATIVE
Protein, ur: NEGATIVE
RBC / HPF: NONE SEEN /HPF (ref 0–2)
Specific Gravity, Urine: 1.008 (ref 1.001–1.035)
Squamous Epithelial / HPF: NONE SEEN /HPF (ref <=5)
WBC, UA: NONE SEEN /HPF (ref 0–5)
pH: 6 (ref 5.0–8.0)

## 2020-10-28 LAB — NO CULTURE INDICATED

## 2020-10-28 LAB — COMPLETE METABOLIC PANEL WITH GFR
AG Ratio: 1.4 (calc) (ref 1.0–2.5)
ALT: 30 U/L — ABNORMAL HIGH (ref 6–29)
AST: 22 U/L (ref 10–35)
Albumin: 4.2 g/dL (ref 3.6–5.1)
Alkaline phosphatase (APISO): 28 U/L — ABNORMAL LOW (ref 37–153)
BUN: 12 mg/dL (ref 7–25)
CO2: 27 mmol/L (ref 20–32)
Calcium: 9.5 mg/dL (ref 8.6–10.4)
Chloride: 106 mmol/L (ref 98–110)
Creat: 0.84 mg/dL (ref 0.50–0.99)
GFR, Est African American: 85 mL/min/{1.73_m2} (ref >=60)
GFR, Est Non African American: 73 mL/min/{1.73_m2} (ref >=60)
Globulin: 3 g/dL (calc) (ref 1.9–3.7)
Glucose, Bld: 94 mg/dL (ref 65–139)
Potassium: 4.3 mmol/L (ref 3.5–5.3)
Sodium: 142 mmol/L (ref 135–146)
Total Bilirubin: 0.6 mg/dL (ref 0.2–1.2)
Total Protein: 7.2 g/dL (ref 6.1–8.1)

## 2020-10-28 LAB — T3, FREE: T3, Free: 4.3 pg/mL — ABNORMAL HIGH (ref 2.3–4.2)

## 2020-10-28 LAB — TSH: TSH: 0.1 mIU/L — ABNORMAL LOW (ref 0.40–4.50)

## 2020-11-07 ENCOUNTER — Ambulatory Visit (INDEPENDENT_AMBULATORY_CARE_PROVIDER_SITE_OTHER): Payer: 59 | Admitting: Internal Medicine

## 2020-11-14 ENCOUNTER — Telehealth (INDEPENDENT_AMBULATORY_CARE_PROVIDER_SITE_OTHER): Payer: Self-pay

## 2020-11-14 ENCOUNTER — Other Ambulatory Visit (INDEPENDENT_AMBULATORY_CARE_PROVIDER_SITE_OTHER): Payer: Self-pay | Admitting: Internal Medicine

## 2020-11-14 DIAGNOSIS — R3915 Urgency of urination: Secondary | ICD-10-CM

## 2020-11-14 NOTE — Telephone Encounter (Signed)
Okay I put the order in for the referral.

## 2020-11-14 NOTE — Telephone Encounter (Signed)
Mrs Carolyn Stone  called to see if you will referral her to the urology in [place]. Pt is having difficulty with Ditropan.

## 2020-11-16 ENCOUNTER — Other Ambulatory Visit (INDEPENDENT_AMBULATORY_CARE_PROVIDER_SITE_OTHER): Payer: Self-pay | Admitting: Internal Medicine

## 2020-12-05 ENCOUNTER — Encounter: Payer: Self-pay | Admitting: Physician Assistant

## 2020-12-05 ENCOUNTER — Telehealth: Payer: Medicare Other | Admitting: Physician Assistant

## 2020-12-05 ENCOUNTER — Telehealth (INDEPENDENT_AMBULATORY_CARE_PROVIDER_SITE_OTHER): Payer: Self-pay

## 2020-12-05 DIAGNOSIS — U071 COVID-19: Secondary | ICD-10-CM

## 2020-12-05 MED ORDER — NIRMATRELVIR/RITONAVIR (PAXLOVID)TABLET: 3.0000 | ORAL_TABLET | Freq: Two times a day (BID) | ORAL | 0 refills | Status: AC

## 2020-12-05 NOTE — Patient Instructions (Signed)
[facility] are being placed in the home monitoring program for COVID-19 (commonly known as Coronavirus).  This is because you are suspected to have the virus or are known to have the virus.  If you are unsure which group you fall into call your clinic.    As part of this program, you'll answer a daily questionnaire in the MyChart mobile app. You'll receive a notification through the MyChart app when the questionnaire is available. When you log in to MyChart, you'll see the tasks in your To Do activity.       Clinicians will see any answers that are concerning and take appropriate steps.  If at any point you are having a medical emergency, call 911.  If otherwise concerned call your clinic instead of coming into the clinic or hospital.  To keep from spreading the disease you should: Stay home and limit contact with other people as much as possible.  Wash your hands frequently. Cover your coughs and sneezes with a tissue, and throw used tissues in the trash.   Clean and disinfect frequently touched surfaces and objects.    Take care of yourself by: Staying home Resting Drinking fluids Take fever-reducing medications (Tylenol/Acetaminophen and Ibuprofen)  For more information on the disease go to the Centers for Disease Control and Prevention website     You are being prescribed PAXLOVID for COVID-19 infection.   Please call the pharmacy or go through the drive through vs going inside if you are picking up the mediation yourself to prevent further spread. If prescribed to a [facility] affiliated pharmacy, a pharmacist will bring the medication out to your car.   Medications to hold while taking this treatment: none  *If asked to hold, you can resume them 24 hours after your last dose   ADMINISTRATION INSTRUCTIONS: Take with or without food. Swallow the tablets whole. Don't chew, crush, or break the medications because it might not work as well  For each dose of the  medication, you should be taking 3 tablets together (2 pink oval and 1 white oval) TWICE a day for FIVE days   Finish your full five-day course of Paxlovid even if you feel better before you're done. Stopping this medication too early can make it less effective to prevent severe illness related to [place].    Paxlovid is prescribed for YOU ONLY. Don't share it with others, even if they have similar symptoms as you. This medication might not be right for everyone.  Make sure to take steps to protect yourself and others while you're taking this medication in order to get well soon and to prevent others from getting sick with COVID-19.  Paxlovid (nirmatrelvir / ritonavir) can cause hormonal birth control medications to not work well. If you or your partner is currently taking hormonal birth control, use condoms or other birth control methods to prevent unintended pregnancies.    COMMON SIDE EFFECTS: Altered or bad taste in your mouth  Diarrhea  High blood pressure (1% of people) Muscle aches (1% of people)     If your COVID-19 symptoms get worse, get medical help right away. Call 911 if you experience symptoms such as worsening cough, trouble breathing, chest pain that doesn't go away, confusion, a hard time staying awake, and pale or blue-colored skin. This medication won't prevent all COVID-19 cases from getting worse.   Can take to lessen severity: Vit C 500mg  twice daily Quercertin 250-500mg  twice daily Zinc 75-100mg  daily Melatonin 3-6 mg  at bedtime Vit D3 1000-2000 IU daily Aspirin 81 mg daily with food Optional: Famotidine 20mg  daily Also can add tylenol/ibuprofen as needed for fevers and body aches May add Mucinex or Mucinex DM as needed for cough/congestion  10 Things You Can Do to Manage Your COVID-19 Symptoms at Home If you have possible or confirmed COVID-19 Stay home except to get medical care. Monitor your symptoms carefully. If your symptoms get worse, call your  healthcare provider immediately. Get rest and stay hydrated. If you have a medical appointment, call the healthcare provider ahead of time and tell them that you have or may have COVID-19. For medical emergencies, call 911 and notify the dispatch personnel that you have or may have COVID-19. Cover your cough and sneezes with a tissue or use the inside of your elbow. Wash your hands often with soap and water for at least 20 seconds or clean your hands with an alcohol-based hand sanitizer that contains at least 60% alcohol. As much as possible, stay in a specific room and away from other people in your home. Also, you should use a separate bathroom, if available. If you need to be around other people in or outside of the home, wear a mask. Avoid sharing personal items with other people in your household, like dishes, towels, and bedding. Clean all surfaces that are touched often, like counters, tabletops, and doorknobs. Use household cleaning sprays or wipes according to the label instructions. [email or web address] 12/11/2019 This information is not intended to replace advice given to you by your health care provider. Make sure you discuss any questions you have with your healthcare provider. Document Revised: 07/01/2020 Document Reviewed: 07/01/2020 Elsevier Patient Education  [place].

## 2020-12-05 NOTE — Telephone Encounter (Signed)
Patient called and stated that she tested positive for Covid and wanted to know if there is anything that can help her with her symptoms.  I called the patient back and she stated that she started feeling bad on Saturday evening. Patient took a home Covid test on Sunday and tested positive. Patient currently has a headache, cough, nasal congestion, nausea, not able to eat much, a low grade fever of 99.1, and some wheezing but stated not bad. Patient is taking Mucinex Cough and Cold and helping some. Patient uses [facility] in [place].  Please advise if something can be sent in for her.

## 2020-12-05 NOTE — Progress Notes (Signed)
Ms. Carolyn Stone, Carolyn Stone are scheduled for a virtual visit with your provider today.    Just as we do with appointments in the office, we must obtain your consent to participate.  Your consent will be active for this visit and any virtual visit you may have with one of our providers in the next 365 days.    If you have a MyChart account, I can also send a copy of this consent to you electronically.  All virtual visits are billed to your insurance company just like a traditional visit in the office.  As this is a virtual visit, video technology does not allow for your provider to perform a traditional examination.  This may limit your provider's ability to fully assess your condition.  If your provider identifies any concerns that need to be evaluated in person or the need to arrange testing such as labs, EKG, etc, we will make arrangements to do so.    Although advances in technology are sophisticated, we cannot ensure that it will always work on either your end or our end.  If the connection with a video visit is poor, we may have to switch to a telephone visit.  With either a video or telephone visit, we are not always able to ensure that we have a secure connection.   I need to obtain your verbal consent now.   Are you willing to proceed with your visit today?   KALEB LINQUIST has provided verbal consent on 12/05/2020 for a virtual visit (video or telephone).   Mar Daring, PA-C 12/05/2020  11:45 AM  Virtual Visit Consent   Mikeal Hawthorne, you are scheduled for a virtual visit with a [facility] provider today.     Just as with appointments in the office, your consent must be obtained to participate.  Your consent will be active for this visit and any virtual visit you may have with one of our providers in the next 365 days.     If you have a MyChart account, a copy of this consent can be sent to you electronically.  All virtual visits are billed to your insurance company just like a  traditional visit in the office.    As this is a virtual visit, video technology does not allow for your provider to perform a traditional examination.  This may limit your provider's ability to fully assess your condition.  If your provider identifies any concerns that need to be evaluated in person or the need to arrange testing (such as labs, EKG, etc.), we will make arrangements to do so.     Although advances in technology are sophisticated, we cannot ensure that it will always work on either your end or our end.  If the connection with a video visit is poor, the visit may have to be switched to a telephone visit.  With either a video or telephone visit, we are not always able to ensure that we have a secure connection.     I need to obtain your verbal consent now.   Are you willing to proceed with your visit today?    [name] has provided verbal consent on 12/05/2020 for a virtual visit (video or telephone).   Mar Daring, PA-C   Date: 12/05/2020 11:45 AM   Virtual Visit via Video Note   I, Mar Daring, connected with  Carolyn Stone  ([id number], 07-13-1955) on 12/05/20 at 12:00 PM EDT by a video-enabled telemedicine application and  verified that I am speaking with the correct person using two identifiers.  Location: Patient: Virtual Visit Location Patient: Home Provider: Virtual Visit Location Provider: Home Office   I discussed the limitations of evaluation and management by telemedicine and the availability of in person appointments. The patient expressed understanding and agreed to proceed.    History of Present Illness: Carolyn Stone is a 65 y.o. who identifies as a female who was assigned female at birth, and is being seen today for covid 83.  HPI: URI  This is a new problem. The current episode started in the past 7 days (symptoms started Saturday night, tested positive for Covid 19 yesterday). The problem has been unchanged. Associated  symptoms include chest pain (sore with coughing), congestion, coughing, diarrhea (loose stools this morning), ear pain (yesterday, better today), headaches, nausea (after medications), rhinorrhea and sinus pain (mild). Pertinent negatives include no sore throat or vomiting. Associated symptoms comments: Body aches, fatigue. She has tried acetaminophen (Mucinex, dimetap, tylenol) for the symptoms. The treatment provided moderate relief.     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   Encounter for long-term (current) use of high-risk medication 11/19/2018   Connective tissue disorder ([place]) 12/05/2017   Atypical nevi 10/24/2017   Positive ANA (antinuclear antibody) 10/24/2017   Right hip pain 08/06/2017   Greater trochanteric bursitis of right hip 08/06/2017   Diabetes ([place]) 12/25/2016   Dysphagia 12/25/2016   Essential hypertension 04/20/2015   Stiffness of joint, not elsewhere classified, ankle and foot 07/22/2013   Pain in joint, ankle and foot 07/22/2013   Difficulty in walking(719.7) 07/22/2013   Myalgia and myositis    NEOPLASM, SKIN, UNCERTAIN BEHAVIOR 51/70/0174   ACTINIC KERATOSIS 08/18/2008   Impaired fasting glucose 12/01/2007   Hyperlipemia 12/01/2007   OBESITY 12/01/2007   ALLERGIC RHINITIS 12/01/2007   GERD 12/01/2007   ARTHRITIS 12/01/2007   ACHILLES TENDINITIS, BILATERAL 12/01/2007   INCREASED BLOOD PRESSURE 12/01/2007    Allergies:  Allergies  Allergen Reactions   Bee Venom Other (See Comments) and Swelling   Nitrofurantoin Other (See Comments)    REACTION: weakness, vomiting, fainting   Nitrofurantoin Macrocrystal Nausea And Vomiting and Nausea Only   Medications:  Current Outpatient Medications:    Acetaminophen (TYLENOL ARTHRITIS PAIN PO), Tylenol Arthritis Pain, Disp: , Rfl:    Cholecalciferol (VITAMIN D3) 125 MCG (5000 UT) TABS, Take 2 tablets by mouth  daily., Disp: , Rfl:    clobetasol cream (TEMOVATE) 0.05 %, clobetasol 0.05 % topical cream  APPLY CREAM TOPICALLY TWICE DAILY TO RED ITCHY AREAS OF BODY FOR 2 4 WEEKS, Disp: , Rfl:    cyclobenzaprine (FLEXERIL) 5 MG tablet, Take 5 mg by mouth at bedtime as needed., Disp: , Rfl:    diclofenac sodium (VOLTAREN) 1 % GEL, Apply 2 g topically 4 (four) times daily. Prn pain, Disp: 100 g, Rfl: 0   enalapril (VASOTEC) 5 MG tablet, Take 1 tablet (5 mg total) by mouth daily., Disp: 90 tablet, Rfl: 1   fluticasone (FLONASE) 50 MCG/ACT nasal spray, Place 1 spray into both nostrils 2 (two) times daily., Disp: 16 g, Rfl: 3   hydroxychloroquine (PLAQUENIL) 200 MG tablet, Take 400 mg by mouth daily., Disp: , Rfl:    Lifitegrast (XIIDRA) 5 % SOLN, Xiidra 5 % eye drops in a dropperette, Disp: , Rfl:    montelukast (SINGULAIR) 10 MG tablet, Take 1 tablet by  mouth once daily, Disp: 90 tablet, Rfl: 0   NP THYROID 120 MG tablet, Take 1 tablet (120 mg total) by mouth daily before breakfast., Disp: 30 tablet, Rfl: 3   NP THYROID 30 MG tablet, Take 1 tablet (30 mg total) by mouth daily before lunch., Disp: 30 tablet, Rfl: 3   oxybutynin (DITROPAN) 5 MG tablet, Take 1 tablet (5 mg total) by mouth 3 (three) times daily., Disp: 90 tablet, Rfl: 3   pantoprazole (PROTONIX) 40 MG tablet, Take 1 tablet by mouth once daily, Disp: 30 tablet, Rfl: 3  Observations/Objective: Patient is well-developed, well-nourished in no acute distress.  Resting comfortably at home.  Head is normocephalic, atraumatic.  No labored breathing. Dry cough heard a few times through call. No adventitious lung sounds heard through video Speech is clear and coherent with logical content.  Patient is alert and oriented at baseline.   Assessment and Plan: There are no diagnoses linked to this encounter. - Continue OTC symptomatic management of choice - Will send OTC vitamins and supplement information through AVS - Paxlovid prescribed - Patient  enrolled in MyChart symptom monitoring - Push fluids - Rest as needed - Discussed return precautions and when to seek in-person evaluation, sent via AVS as well  Follow Up Instructions: I discussed the assessment and treatment plan with the patient. The patient was provided an opportunity to ask questions and all were answered. The patient agreed with the plan and demonstrated an understanding of the instructions.  A copy of instructions were sent to the patient via MyChart.  The patient was advised to call back or seek an in-person evaluation if the symptoms worsen or if the condition fails to improve as anticipated.  Time:  I spent 13 minutes with the patient via telehealth technology discussing the above problems/concerns.    Mar Daring, PA-C

## 2020-12-05 NOTE — Telephone Encounter (Signed)
Called patient and advised for her to schedule a MyChart visit and she stated that she can do that. Patient verbalized an understanding and thanked [place].

## 2020-12-14 ENCOUNTER — Telehealth (INDEPENDENT_AMBULATORY_CARE_PROVIDER_SITE_OTHER): Payer: Self-pay

## 2020-12-14 NOTE — Telephone Encounter (Signed)
Called patient and gave her the recommendations per Dr. Anastasio Champion. Patient verbalized an understanding and thanked [place].

## 2020-12-14 NOTE — Telephone Encounter (Signed)
Patient called and left a VM to call back.  Called patient back and she stated that she finished her antiviral medication for Covid and now her cough has come back and having a hard time sleeping at night. Patient is having nasal congestion and has been taking Mucinex Max and not helping now and wanted to know what else she could take for her cough and nasal congestion?

## 2020-12-14 NOTE — Telephone Encounter (Signed)
She can try Robitussin-DM over-the-counter.

## 2020-12-14 NOTE — Telephone Encounter (Signed)
Patient is asking if there is anything that may help her cough? She has not been able to sleep at night due to coughing.

## 2020-12-14 NOTE — Telephone Encounter (Signed)
I would recommend Flonase nasal spray over-the-counter.  That might help her symptoms.  Unfortunately, there is nothing else I can recommend and it will just be a matter of time before hopefully she does improve.

## 2020-12-19 ENCOUNTER — Other Ambulatory Visit (INDEPENDENT_AMBULATORY_CARE_PROVIDER_SITE_OTHER): Payer: Self-pay | Admitting: Internal Medicine

## 2020-12-19 MED ORDER — GUAIFENESIN-CODEINE 100-10 MG/5ML PO SYRP: 5.0000 mL | ORAL_SOLUTION | Freq: Three times a day (TID) | ORAL | 0 refills | Status: DC | PRN

## 2020-12-19 NOTE — Telephone Encounter (Signed)
Patient called and left a detailed voice message and she was coughing a lot and she stated that nothing is working for her cough and asked if you could send her in some cough syrup because she is not able to sleep and her cough is not getting better.

## 2020-12-19 NOTE — Telephone Encounter (Signed)
Called patient and gave her the message. Patient stated that [facility] already contacted her. Patient verbalized an understanding and thanked [place].

## 2020-12-19 NOTE — Telephone Encounter (Signed)
Okay I have sent Robitussin with codeine to [facility] in [place].

## 2021-01-01 ENCOUNTER — Other Ambulatory Visit (INDEPENDENT_AMBULATORY_CARE_PROVIDER_SITE_OTHER): Payer: Self-pay | Admitting: Internal Medicine

## 2021-01-04 NOTE — Telephone Encounter (Signed)
Please call patient let her know that I did chart review and I recommend that she stop the 30 mg tablet of NP thyroid daily.  This is based on her last lab results that I looked at.  She should continue on the 120 mg by mouth daily, and she needs to follow-up with either endocrinology or primary care provider as soon as possible so they can repeat blood work and make sure that the reduced dose of the NP thyroid is appropriate for her.  From what I can tell in her chart she is on NP thyroid for the off label treatment of her symptoms but not actual hypothyroidism.  Thus, please provide her with the bioidentical hormone replacement therapy providers in the area that she can reach out to regarding hormone management.  If I am mistaken she actually does have hypothyroidism and I can certainly refer her to endocrinology and/or her primary care provider may be willing to manage her thyroid hormone dosages and continue checking her serum levels.  Please let me know if she has any questions or if I need to order referral to endocrinology.  Thank you.

## 2021-01-11 ENCOUNTER — Other Ambulatory Visit (INDEPENDENT_AMBULATORY_CARE_PROVIDER_SITE_OTHER): Payer: Self-pay

## 2021-01-11 DIAGNOSIS — M359 Systemic involvement of connective tissue, unspecified: Secondary | ICD-10-CM | POA: Insufficient documentation

## 2021-01-12 NOTE — Telephone Encounter (Signed)
I had discussion with this patient regarding her medications.  We decided not to refill the medications because she has started seeing a different provider for her bioidentical hormone replacement therapy.  They are not take over care of the hormones, but she was encouraged to reach out before our office closes permanently by the end of this month if she has any other concerns.

## 2021-01-24 NOTE — Telephone Encounter (Signed)
Called patient and left a detailed voice message and will try to call the patient again soon.

## 2021-03-02 ENCOUNTER — Encounter (INDEPENDENT_AMBULATORY_CARE_PROVIDER_SITE_OTHER): Payer: Medicare Other | Admitting: Internal Medicine

## 2021-03-15 ENCOUNTER — Other Ambulatory Visit: Payer: Self-pay

## 2021-03-15 ENCOUNTER — Ambulatory Visit: Payer: Medicare Other | Admitting: Podiatry

## 2021-03-15 ENCOUNTER — Ambulatory Visit: Payer: Medicare Other

## 2021-03-15 ENCOUNTER — Encounter: Payer: Self-pay | Admitting: Podiatry

## 2021-03-15 DIAGNOSIS — D2372 Other benign neoplasm of skin of left lower limb, including hip: Secondary | ICD-10-CM | POA: Diagnosis not present

## 2021-03-15 DIAGNOSIS — M67479 Ganglion, unspecified ankle and foot: Secondary | ICD-10-CM

## 2021-03-15 NOTE — Progress Notes (Signed)
She presents today concerned about a small area to the lateral aspect of the fourth toe right foot and third toe left foot.  She is concerned about these little dry calluses.  Objective: Vital signs are stable she alert oriented x3.  Due to the juxtaposition of the toes there is irritation of bone to bone contact resulting in hyperkeratotic lesions.  There are no open lesions or wounds.  Assessment: Pain in limb secondary to digital deformities resulting in benign skin lesions.  Plan: Debridement of all benign skin lesions placed padding discussed appropriate shoe gear follow-up with her as needed.

## 2021-08-31 ENCOUNTER — Encounter (INDEPENDENT_AMBULATORY_CARE_PROVIDER_SITE_OTHER): Payer: Self-pay | Admitting: *Deleted

## 2021-10-10 DIAGNOSIS — E669 Obesity, unspecified: Secondary | ICD-10-CM | POA: Insufficient documentation

## 2021-10-20 LAB — BASIC METABOLIC PANEL
BUN: 13 (ref 4–21)
CO2: 25 — AB (ref 13–22)
Chloride: 107 (ref 99–108)
Creatinine: 0.7 (ref 0.5–1.1)
Glucose: 133
Potassium: 4.1 mEq/L (ref 3.5–5.1)
Sodium: 141 (ref 137–147)

## 2021-10-20 LAB — COMPREHENSIVE METABOLIC PANEL
Albumin: 3.9 (ref 3.5–5.0)
Calcium: 9.2 (ref 8.7–10.7)
Globulin: 2.8

## 2021-10-20 LAB — LIPID PANEL
Cholesterol: 150 (ref 0–200)
HDL: 39 (ref 35–70)
LDL Cholesterol: 86
Triglycerides: 150 (ref 40–160)

## 2021-10-20 LAB — HEPATIC FUNCTION PANEL
AST: 18 (ref 13–35)
Alkaline Phosphatase: 30 (ref 25–125)
Bilirubin, Total: 0.9

## 2021-10-20 LAB — TSH: TSH: 0.38 — AB (ref 0.41–5.90)

## 2021-10-20 LAB — HEMOGLOBIN A1C: Hemoglobin A1C: 5.6

## 2022-02-21 ENCOUNTER — Encounter: Payer: Self-pay | Admitting: "Endocrinology

## 2022-02-21 ENCOUNTER — Ambulatory Visit: Payer: Medicare Other | Admitting: "Endocrinology

## 2022-02-21 VITALS — BP 142/80 | HR 68 | Ht 62.0 in | Wt 203.4 lb

## 2022-02-21 DIAGNOSIS — E0580 Other thyrotoxicosis without thyrotoxic crisis or storm: Secondary | ICD-10-CM

## 2022-02-21 DIAGNOSIS — E039 Hypothyroidism, unspecified: Secondary | ICD-10-CM | POA: Diagnosis not present

## 2022-02-21 MED ORDER — LEVOTHYROXINE SODIUM 100 MCG PO TABS: 100.0000 ug | ORAL_TABLET | Freq: Every day | ORAL | 2 refills | Status: DC

## 2022-02-21 NOTE — Progress Notes (Signed)
Endocrinology Consult Note                                            02/21/2022, 5:26 PM   Subjective:    Patient ID: Carolyn Stone, female    DOB: [birth year], PCP Bucio, [place] Dragon, FNP   Past Medical History:  Diagnosis Date   Allergy    Anxiety    Arthritis    Connective tissue disorder ([place])    Dysphagia 12/25/2016   Hypertension    Hypothyroidism    Myalgia and myositis    "undetermined inflammatory process"   Past Surgical History:  Procedure Laterality Date   CARPAL TUNNEL RELEASE Bilateral    CESAREAN SECTION     DILATION AND CURETTAGE OF UTERUS     x2   FOOT SURGERY Left    Social History   Socioeconomic History   Marital status: Married    Spouse name: Araceli Bouche   Number of children: 2   Years of education: 16   Highest education level: Not on file  Occupational History   Occupation: teacher-part-time    Comment: GED classes  Tobacco Use   Smoking status: Never   Smokeless tobacco: Never  Vaping Use   Vaping Use: Never used  Substance and Sexual Activity   Alcohol use: No   Drug use: No   Sexual activity: Yes    Partners: Male    Birth control/protection: Post-menopausal    Comment: husband s/p vasectomy  Other Topics Concern   Not on file  Social History Narrative   Lives with her husband of 10 years.  Their 2 daughters are grown and live independently.Retired Pharmacist, hospital.   Social Determinants of Health   Financial Resource Strain: Not on file  Food Insecurity: Not on file  Transportation Needs: Not on file  Physical Activity: Not on file  Stress: Not on file  Social Connections: Not on file   Family History  Problem Relation Age of Onset   Cancer Mother        squamous cell-larygopharyngeal-died age 68   Cancer Father        lung/liver cancer- died age 27   Mental illness Father        ?schizophrenia   Hypertension Brother    Mental illness Sister        Bipolar Disorder   Cancer Maternal Grandmother    Hypertension  Paternal Grandmother    Hyperlipidemia Paternal Grandmother    Stroke Paternal Grandfather    Outpatient Encounter Medications as of 02/21/2022  Medication Sig   BIOTIN PO Take 1 tablet by mouth every other day.   levothyroxine (SYNTHROID) 100 MCG tablet Take 1 tablet (100 mcg total) by mouth daily before breakfast.   Metoprolol Succinate 25 MG CS24 Take 25 mg by mouth 2 (two) times daily with a meal.   Multiple Vitamin (MULTIVITAMIN ADULT PO) Take 1 tablet by mouth every other day.   Acetaminophen (TYLENOL ARTHRITIS PAIN PO) Tylenol Arthritis Pain   Cholecalciferol (VITAMIN D3) 125 MCG (5000 UT) TABS Take 2 tablets by mouth daily.   clobetasol cream (TEMOVATE) 0.05 % clobetasol 0.05 % topical cream  APPLY CREAM TOPICALLY TWICE DAILY TO RED ITCHY AREAS OF BODY FOR 2 4 WEEKS   diclofenac sodium (VOLTAREN) 1 % GEL Apply 2 g topically 4 (four) times daily. Prn pain  enalapril (VASOTEC) 5 MG tablet Take 1 tablet (5 mg total) by mouth daily. (Patient taking differently: Take 10 mg by mouth 2 (two) times daily.)   guaiFENesin-codeine (ROBITUSSIN AC) 100-10 MG/5ML syrup Take 5 mLs by mouth 3 (three) times daily as needed for cough.   hydroxychloroquine (PLAQUENIL) 200 MG tablet Take 400 mg by mouth daily.   Lifitegrast (XIIDRA) 5 % SOLN Xiidra 5 % eye drops in a dropperette   methylPREDNISolone (MEDROL DOSEPAK) 4 MG TBPK tablet Take by mouth as directed.   montelukast (SINGULAIR) 10 MG tablet Take 1 tablet by mouth once daily   pantoprazole (PROTONIX) 40 MG tablet Take 1 tablet by mouth once daily   promethazine-dextromethorphan (PROMETHAZINE-DM) 6.25-15 MG/5ML syrup Take 5 mLs by mouth every 6 (six) hours as needed.   [DISCONTINUED] cyclobenzaprine (FLEXERIL) 5 MG tablet Take 5 mg by mouth at bedtime as needed.   [DISCONTINUED] hydroxychloroquine (PLAQUENIL) 200 MG tablet Take 2 tablets by mouth daily.   [DISCONTINUED] NP THYROID 120 MG tablet Take 1 tablet (120 mg total) by mouth daily before  breakfast.   [DISCONTINUED] NP THYROID 30 MG tablet Take 1 tablet (30 mg total) by mouth daily before lunch.   [DISCONTINUED] NP THYROID 90 MG tablet Take 90 mg by mouth daily.   [DISCONTINUED] progesterone (PROMETRIUM) 200 MG capsule Take 1 capsule by mouth once daily   No facility-administered encounter medications on file as of 02/21/2022.   ALLERGIES: Allergies  Allergen Reactions   Bee Venom Other (See Comments) and Swelling   Nitrofurantoin Other (See Comments)    REACTION: weakness, vomiting, fainting   Nitrofurantoin Macrocrystal Nausea And Vomiting and Nausea Only    VACCINATION STATUS: Immunization History  Administered Date(s) Administered   Influenza Whole 03/26/2008   Influenza,inj,Quad PF,6+ Mos 06/15/2015, 05/18/2019   Influenza,inj,quad, With Preservative 03/15/2017   Influenza-Unspecified 03/01/2020   Moderna Sars-Covid-2 Vaccination 07/27/2019, 08/27/2019, 04/11/2020   Tdap 11/20/2013    HPI Carolyn Stone is 66 y.o. female who presents today with a medical history as above. she is being seen in consultation for hypothyroidism requested by Bucio, [place] Dragon, FNP.  History is obtained directly from the patient as well as chart review.  Reportedly, she was diagnosed with hypothyroidism at approximate age of 50 years.  She was started on NP thyroid at times up to 150 mg daily.  Most recently it was lowered to 90 mg daily.  She did have considerable problem regulating her thyroid function tests.  Was found to have suppressed TSH for the last 3 thyroid hormone measurements.  She does have palpitations which required high-dose beta-blockers. She also has some anxiety, tremors. Her other medical problems include hypertension, IBS, myositis, arthralgias/arthritis, connective tissue disorders.  She denies dysphagia, shortness of breath, nor voice change. Review of Systems  Constitutional: + Fluctuating body weight, + fatigue, no subjective hyperthermia, no subjective  hypothermia Eyes: no blurry vision, no xerophthalmia ENT: no sore throat, no nodules palpated in throat, no dysphagia/odynophagia, no hoarseness Cardiovascular: no Chest Pain, no Shortness of Breath, no palpitations, no leg swelling Respiratory: no cough, no shortness of breath Gastrointestinal: no Nausea/Vomiting/Diarhhea Musculoskeletal: no muscle/joint aches Skin: no rashes Neurological: no tremors, no numbness, no tingling, no dizziness Psychiatric: no depression, no anxiety  Objective:       02/21/2022    1:15 PM 10/27/2020    2:48 PM 08/03/2020    2:59 PM  Vitals with BMI  Height '5\' 2"'$  '5\' 3"'$  '5\' 3"'$   Weight 203 lbs 6 oz  196 lbs 5 oz 191 lbs 13 oz  BMI 37.19 34.19 62.22  Systolic 979 892 119  Diastolic 80 80 80  Pulse 68 89 83    BP (!) 142/80   Pulse 68   Ht '5\' 2"'$  (1.575 m)   Wt 203 lb 6.4 oz (92.3 kg)   BMI 37.20 kg/m   Wt Readings from Last 3 Encounters:  02/21/22 203 lb 6.4 oz (92.3 kg)  10/27/20 196 lb 4.8 oz (89 kg)  08/03/20 191 lb 12.8 oz (87 kg)    Physical Exam  Constitutional:  Body mass index is 37.2 kg/m.,  not in acute distress, normal state of mind Eyes: PERRLA, EOMI, no exophthalmos ENT: moist mucous membranes, + palpable thyromegaly, no gross cervical lymphadenopathy Cardiovascular: normal precordial activity, Regular Rate and Rhythm, no Murmur/Rubs/Gallops Respiratory:  adequate breathing efforts, no gross chest deformity, Clear to auscultation bilaterally Gastrointestinal: abdomen soft, Non -tender, No distension, Bowel Sounds present, no gross organomegaly Musculoskeletal: no gross deformities, strength intact in all four extremities Skin: moist, warm, no rashes Neurological: no tremor with outstretched hands, Deep tendon reflexes normal in bilateral lower extremities.  CMP ( most recent) CMP     Component Value Date/Time   NA 141 10/20/2021 0000   K 4.1 10/20/2021 0000   CL 107 10/20/2021 0000   CO2 25 (A) 10/20/2021 0000   GLUCOSE 94  10/27/2020 1526   BUN 13 10/20/2021 0000   CREATININE 0.7 10/20/2021 0000   CREATININE 0.84 10/27/2020 1526   CALCIUM 9.2 10/20/2021 0000   PROT 7.2 10/27/2020 1526   PROT 7.7 08/02/2016 1001   ALBUMIN 3.9 10/20/2021 0000   ALBUMIN 4.4 08/02/2016 1001   AST 18 10/20/2021 0000   ALT 30 (H) 10/27/2020 1526   ALKPHOS 30 10/20/2021 0000   BILITOT 0.6 10/27/2020 1526   BILITOT 0.8 08/02/2016 1001   GFRNONAA 73 10/27/2020 1526   GFRAA 85 10/27/2020 1526     Diabetic Labs (most recent): Lab Results  Component Value Date   HGBA1C 5.6 10/20/2021   HGBA1C 5.4 08/03/2020   HGBA1C 5.5 07/07/2019   MICROALBUR 0.9 08/03/2020   MICROALBUR 0.50 08/18/2008     Lipid Panel ( most recent) Lipid Panel     Component Value Date/Time   CHOL 150 10/20/2021 0000   CHOL 218 (H) 08/02/2016 1001   TRIG 150 10/20/2021 0000   HDL 39 10/20/2021 0000   HDL 43 08/02/2016 1001   CHOLHDL 4.0 08/03/2020 1531   VLDL 25 05/07/2014 1111   LDLCALC 86 10/20/2021 0000   LDLCALC 113 (H) 08/03/2020 1531   LABVLDL 33 08/02/2016 1001      Lab Results  Component Value Date   TSH 0.38 (A) 10/20/2021   TSH 0.10 (L) 10/27/2020   TSH 0.43 08/03/2020   TSH 2.28 07/07/2019   FREET4 1.0 08/03/2020           Assessment & Plan:   1. Hypothyroidism, unspecified type  - Carolyn Stone  is being seen at a kind request of Bucio, [place], FNP. - I have reviewed her available thyroid records and clinically evaluated the patient. - Based on these reviews, she has hypothyroidism with evidence of iatrogenic thyrotoxicosis. This seems to result from difficulty regulating her thyroid hormone on the background of NP thyroid.  Had a long discussion with her and she agrees to switch her NP thyroid to levothyroxine.  I discussed and lowered her dose from current equivalent  of 150 mcg to 100  mcg of levothyroxine.  She is advised to discontinue NP thyroid.   - We discussed about the correct intake of her thyroid  hormone, on empty stomach at fasting, with water, separated by at least 30 minutes from breakfast and other medications,  and separated by more than 4 hours from calcium, iron, multivitamins, acid reflux medications (PPIs). -Patient is made aware of the fact that thyroid hormone replacement is needed for life, dose to be adjusted by periodic monitoring of thyroid function tests.  We will be considered for baseline thyroid ultrasound after her next visit. -she will return in 12 week to review her repeat labs.  Next labs will include: - TSH - T4, free - Thyroid peroxidase antibody - Thyroglobulin antibody  Her pulse rate today is 68.  She complains of fatigue from her high-dose beta-blocker.  I advised her to lower her beta-blocker, metoprolol to 25 mg p.o. daily.  - she is advised to maintain close follow up with Bucio, [place] Dragon, FNP for primary care needs.   - Time spent with the patient: 50 minutes, of which >50% was spent in  counseling her about her hypothyroidism, iatrogenic thyrotoxicosis and the rest in obtaining information about her symptoms, reviewing her previous labs/studies ( including abstractions from other facilities),  evaluations, and treatments,  and developing a plan to confirm diagnosis and long term treatment based on the latest standards of care/guidelines; and documenting her care.  Carolyn Stone participated in the discussions, expressed understanding, and voiced agreement with the above plans.  All questions were answered to her satisfaction. she is encouraged to contact clinic should she have any questions or concerns prior to her return visit.  Follow up plan: Return in about 3 months (around 05/23/2022) for F/U with Pre-visit Labs.   Glade Lloyd, MD [facility] [address] [place], [place] [address] Phone: [phone]  Fax: [phone]     02/21/2022, 5:26 PM  This note was partially dictated with  voice recognition software. Similar sounding words can be transcribed inadequately or may not  be corrected upon review.

## 2022-02-27 ENCOUNTER — Other Ambulatory Visit (INDEPENDENT_AMBULATORY_CARE_PROVIDER_SITE_OTHER): Payer: Self-pay

## 2022-02-27 ENCOUNTER — Encounter (INDEPENDENT_AMBULATORY_CARE_PROVIDER_SITE_OTHER): Payer: Self-pay | Admitting: *Deleted

## 2022-02-27 DIAGNOSIS — Z1211 Encounter for screening for malignant neoplasm of colon: Secondary | ICD-10-CM

## 2022-03-12 ENCOUNTER — Encounter (INDEPENDENT_AMBULATORY_CARE_PROVIDER_SITE_OTHER): Payer: Self-pay

## 2022-03-12 ENCOUNTER — Telehealth (INDEPENDENT_AMBULATORY_CARE_PROVIDER_SITE_OTHER): Payer: Self-pay

## 2022-03-12 MED ORDER — PEG 3350-KCL-NA BICARB-NACL 420 G PO SOLR: 4000.0000 mL | ORAL | 0 refills | Status: DC

## 2022-03-12 NOTE — Telephone Encounter (Signed)
Tamiyah Moulin Ann Quincee Gittens, CMA  ?

## 2022-03-12 NOTE — Telephone Encounter (Signed)
Referring MD/PCP: Leandro Reasoner Buccio  Procedure: Tcs  Reason/Indication:  Screening   Has patient had this procedure before?  No   If so, when, by whom and where?    Is there a family history of colon cancer?  No   Who?  What age when diagnosed?    Is patient diabetic? If yes, Type 1 or Type 2   No       Does patient have prosthetic heart valve or mechanical valve?  No   Do you have a pacemaker/defibrillator?  No   Has patient ever had endocarditis/atrial fibrillation? No   Does patient use oxygen? No   Has patient had joint replacement within last 12 months?  No   Is patient constipated or do they take laxatives? No   Does patient have a history of alcohol/drug use?  No   Have you had a stroke/heart attack last 6 mths? No   Do you take medicine for weight loss?  No  For female patients,: have you had a hysterectomy No                       are you post menopausal yes                      do you still have your menstrual cycle no   Is patient on blood thinner such as Coumadin, Plavix and/or Aspirin? No   Medications: SEE EPIC  Allergies: MACRODANTIN   Medication Adjustment per Dr Delfin Gant   Procedure date & time: 04/11/22 10:45 AM

## 2022-04-11 ENCOUNTER — Encounter (HOSPITAL_COMMUNITY): Payer: Self-pay | Admitting: Gastroenterology

## 2022-04-11 ENCOUNTER — Ambulatory Visit (HOSPITAL_COMMUNITY)
Admission: RE | Admit: 2022-04-11 | Discharge: 2022-04-11 | Disposition: A | Discharge status: Home / Self Care | Payer: Medicare Other | Attending: Gastroenterology | Admitting: Gastroenterology

## 2022-04-11 ENCOUNTER — Ambulatory Visit (HOSPITAL_COMMUNITY): Payer: Medicare Other | Admitting: Anesthesiology

## 2022-04-11 ENCOUNTER — Ambulatory Visit (HOSPITAL_BASED_OUTPATIENT_CLINIC_OR_DEPARTMENT_OTHER): Payer: Medicare Other | Admitting: Anesthesiology

## 2022-04-11 ENCOUNTER — Encounter (INDEPENDENT_AMBULATORY_CARE_PROVIDER_SITE_OTHER): Payer: Self-pay | Admitting: *Deleted

## 2022-04-11 ENCOUNTER — Other Ambulatory Visit: Payer: Self-pay

## 2022-04-11 ENCOUNTER — Encounter (HOSPITAL_COMMUNITY)
Admission: RE | Disposition: A | Discharge status: Home / Self Care | Payer: Self-pay | Source: Home / Self Care | Attending: Gastroenterology

## 2022-04-11 DIAGNOSIS — F419 Anxiety disorder, unspecified: Secondary | ICD-10-CM | POA: Diagnosis not present

## 2022-04-11 DIAGNOSIS — D122 Benign neoplasm of ascending colon: Secondary | ICD-10-CM | POA: Diagnosis not present

## 2022-04-11 DIAGNOSIS — Z79899 Other long term (current) drug therapy: Secondary | ICD-10-CM | POA: Diagnosis not present

## 2022-04-11 DIAGNOSIS — I10 Essential (primary) hypertension: Secondary | ICD-10-CM | POA: Insufficient documentation

## 2022-04-11 DIAGNOSIS — Z800 Family history of malignant neoplasm of digestive organs: Secondary | ICD-10-CM | POA: Insufficient documentation

## 2022-04-11 DIAGNOSIS — D123 Benign neoplasm of transverse colon: Secondary | ICD-10-CM | POA: Diagnosis not present

## 2022-04-11 DIAGNOSIS — E039 Hypothyroidism, unspecified: Secondary | ICD-10-CM | POA: Diagnosis not present

## 2022-04-11 DIAGNOSIS — K219 Gastro-esophageal reflux disease without esophagitis: Secondary | ICD-10-CM | POA: Insufficient documentation

## 2022-04-11 DIAGNOSIS — Z1211 Encounter for screening for malignant neoplasm of colon: Secondary | ICD-10-CM | POA: Diagnosis present

## 2022-04-11 DIAGNOSIS — D120 Benign neoplasm of cecum: Secondary | ICD-10-CM | POA: Insufficient documentation

## 2022-04-11 DIAGNOSIS — Z79890 Hormone replacement therapy (postmenopausal): Secondary | ICD-10-CM | POA: Diagnosis not present

## 2022-04-11 DIAGNOSIS — K5730 Diverticulosis of large intestine without perforation or abscess without bleeding: Secondary | ICD-10-CM | POA: Diagnosis not present

## 2022-04-11 DIAGNOSIS — K635 Polyp of colon: Secondary | ICD-10-CM | POA: Diagnosis not present

## 2022-04-11 DIAGNOSIS — K648 Other hemorrhoids: Secondary | ICD-10-CM | POA: Insufficient documentation

## 2022-04-11 HISTORY — PX: POLYPECTOMY: SHX5525

## 2022-04-11 HISTORY — PX: COLONOSCOPY WITH PROPOFOL: SHX5780

## 2022-04-11 LAB — HM COLONOSCOPY

## 2022-04-11 SURGERY — COLONOSCOPY WITH PROPOFOL
Anesthesia: General

## 2022-04-11 MED ORDER — PROPOFOL 500 MG/50ML IV EMUL
INTRAVENOUS | Status: DC | PRN
  Administered 2022-04-11: 150 ug/kg/min via INTRAVENOUS

## 2022-04-11 MED ORDER — LACTATED RINGERS IV SOLN: INTRAVENOUS | Status: DC | PRN

## 2022-04-11 MED ORDER — LACTATED RINGERS IV SOLN: INTRAVENOUS | Status: DC

## 2022-04-11 MED ORDER — PROPOFOL 10 MG/ML IV BOLUS
INTRAVENOUS | Status: DC | PRN
  Administered 2022-04-11: 100 mg via INTRAVENOUS

## 2022-04-11 MED ORDER — LIDOCAINE HCL (CARDIAC) PF 100 MG/5ML IV SOSY
PREFILLED_SYRINGE | INTRAVENOUS | Status: DC | PRN
  Administered 2022-04-11: 50 mg via INTRAVENOUS

## 2022-04-11 NOTE — Discharge Instructions (Signed)
You are being discharged to home.  Resume your previous diet.  We are waiting for your pathology results.  Your physician has recommended a repeat colonoscopy in three years for surveillance.  

## 2022-04-11 NOTE — Op Note (Signed)
[facility] Patient Name: Carolyn Stone Procedure Date: 04/11/2022 11:27 AM MRN: [id number] Date of Birth: 09-12-1955 Attending MD: Maylon Peppers , , [id number] CSN: [id number] Age: 66 Admit Type: Outpatient Procedure:                Colonoscopy Indications:              Screening for colorectal malignant neoplasm Providers:                Maylon Peppers, Lambert Mody, Everardo Pacific Referring MD:              Medicines:                Monitored Anesthesia Care Complications:            No immediate complications. Estimated Blood Loss:     Estimated blood loss: none. Procedure:                Pre-Anesthesia Assessment:                           - Prior to the procedure, a History and Physical                            was performed, and patient medications, allergies                            and sensitivities were reviewed. The patient's                            tolerance of previous anesthesia was reviewed.                           - The risks and benefits of the procedure and the                            sedation options and risks were discussed with the                            patient. All questions were answered and informed                            consent was obtained.                           - ASA Grade Assessment: II - A patient with mild                            systemic disease.                           After obtaining informed consent, the colonoscope                            was passed under direct vision. Throughout the  procedure, the patient's blood pressure, pulse, and                            oxygen saturations were monitored continuously. The                            [id number]-[id number] ([id number]) was introduced through the                            anus and advanced to the the cecum, identified by                            appendiceal orifice and ileocecal valve. The                             colonoscopy was performed without difficulty. The                            patient tolerated the procedure well. The quality                            of the bowel preparation was adequate. Scope In: 11:44:20 AM Scope Out: 12:07:13 PM Scope Withdrawal Time: 0 hours 19 minutes 39 seconds  Total Procedure Duration: 0 hours 22 minutes 53 seconds  Findings:      The perianal and digital rectal examinations were normal.      Three sessile polyps were found in the transverse colon, ascending colon       and cecum. The polyps were 4 to 10 mm in size. These polyps were removed       with a cold snare. Resection and retrieval were complete.      A few small-mouthed diverticula were found in the sigmoid colon.      Non-bleeding internal hemorrhoids were found during retroflexion. The       hemorrhoids were small. Impression:               - Three 4 to 10 mm polyps in the transverse colon,                            in the ascending colon and in the cecum, removed                            with a cold snare. Resected and retrieved.                           - Diverticulosis in the sigmoid colon.                           - Non-bleeding internal hemorrhoids. Moderate Sedation:      Per Anesthesia Care Recommendation:           - Discharge patient to home (ambulatory).                           - Resume previous diet.                           -  Await pathology results.                           - Repeat colonoscopy in 3 years for surveillance. Procedure Code(s):        --- Professional ---                           [phone], Colonoscopy, flexible; with removal of                            tumor(s), polyp(s), or other lesion(s) by snare                            technique Diagnosis Code(s):        --- Professional ---                           Z12.11, Encounter for screening for malignant                            neoplasm of colon                           D12.3, Benign  neoplasm of transverse colon (hepatic                            flexure or splenic flexure)                           D12.2, Benign neoplasm of ascending colon                           D12.0, Benign neoplasm of cecum                           K64.8, Other hemorrhoids                           K57.30, Diverticulosis of large intestine without                            perforation or abscess without bleeding CPT copyright 2022 American Medical Association. All rights reserved. The codes documented in this report are preliminary and upon coder review may  be revised to meet current compliance requirements. Maylon Peppers, MD Maylon Peppers,  04/11/2022 12:10:45 PM This report has been signed electronically. Number of Addenda: 0

## 2022-04-11 NOTE — Anesthesia Postprocedure Evaluation (Signed)
Anesthesia Post Note  Patient: Carolyn Stone  Procedure(s) Performed: COLONOSCOPY WITH PROPOFOL POLYPECTOMY  Patient location during evaluation: Phase II Anesthesia Type: General Level of consciousness: awake and alert Pain management: pain level controlled Vital Signs Assessment: post-procedure vital signs reviewed and stable Respiratory status: spontaneous breathing, nonlabored ventilation, respiratory function stable and patient connected to nasal cannula oxygen Cardiovascular status: blood pressure returned to baseline and stable Postop Assessment: no apparent nausea or vomiting Anesthetic complications: no   No notable events documented.   Last Vitals:  Vitals:   04/11/22 0941 04/11/22 1209  BP: 139/78 (!) 117/58  Pulse: 88 88  Resp: 18 20  Temp: (!) 36.4 C 36.6 C  SpO2: 97% 95%    Last Pain:  Vitals:   04/11/22 1209  TempSrc: Oral  PainSc: 0-No pain                 Daichi Moris Clyde Canterbury

## 2022-04-11 NOTE — Transfer of Care (Signed)
Immediate Anesthesia Transfer of Care Note  Patient: Carolyn Stone  Procedure(s) Performed: COLONOSCOPY WITH PROPOFOL POLYPECTOMY  Patient Location: Endoscopy Unit  Anesthesia Type:General  Level of Consciousness: drowsy  Airway & Oxygen Therapy: Patient Spontanous Breathing  Post-op Assessment: Report given to RN and Post -op Vital signs reviewed and stable  Post vital signs: Reviewed and stable  Last Vitals:  Vitals Value Taken Time  BP 117/58 04/11/22 1209  Temp 36.6 C 04/11/22 1209  Pulse 88 04/11/22 1209  Resp 20 04/11/22 1209  SpO2 95 % 04/11/22 1209    Last Pain:  Vitals:   04/11/22 1209  TempSrc: Oral  PainSc: 0-No pain      Patients Stated Pain Goal: 9 (45/91/36 8599)  Complications: No notable events documented.

## 2022-04-11 NOTE — H&P (Signed)
Carolyn Stone is an 66 y.o. female.   Chief Complaint: CRC screening HPI: 66 year old female with past medical historyArthritis, anxiety, hypothyroidism, connective tissue disorder, coming for screening colonoscopy. The patient has never had a colonoscopy in the past.  The patient denies having any complaints such as melena, hematochezia, abdominal pain or distention, change in her bowel movement consistency or frequency, no changes in weight recently.  Had an uncle with history of rectal cancer in his 5s.  States that she had a negative Cologuard 2 years ago.  Past Medical History:  Diagnosis Date   Allergy    Anxiety    Arthritis    Connective tissue disorder ([place])    Dysphagia 12/25/2016   Hypertension    Hypothyroidism    Myalgia and myositis    "undetermined inflammatory process"    Past Surgical History:  Procedure Laterality Date   Bilateral foot surgery     CARPAL TUNNEL RELEASE Bilateral    CESAREAN SECTION     DILATION AND CURETTAGE OF UTERUS     x2   FOOT SURGERY Left     Family History  Problem Relation Age of Onset   Cancer Mother        squamous cell-larygopharyngeal-died age 67   Cancer Father        lung/liver cancer- died age 40   Mental illness Father        ?schizophrenia   Mental illness Sister        Bipolar Disorder   Hypertension Brother    Cancer Maternal Grandmother    Hypertension Paternal Grandmother    Hyperlipidemia Paternal Grandmother    Stroke Paternal Grandfather    Colon cancer Maternal Uncle    Social History:  reports that she has never smoked. She has never used smokeless tobacco. She reports that she does not drink alcohol and does not use drugs.  Allergies:  Allergies  Allergen Reactions   Bee Venom Other (See Comments) and Swelling   Macrodantin [Nitrofurantoin Macrocrystal] Nausea And Vomiting and Nausea Only   Nitrofurantoin Other (See Comments)    REACTION: weakness, vomiting, fainting    Medications Prior to  Admission  Medication Sig Dispense Refill   acetaminophen (TYLENOL) 650 MG CR tablet Take 1,300 mg by mouth every 8 (eight) hours as needed for pain.     amLODipine (NORVASC) 10 MG tablet Take 10 mg by mouth daily.     BIOTIN PO Take 1 tablet by mouth every other day.     Cholecalciferol (VITAMIN D3) 125 MCG (5000 UT) TABS Take 10,000 Units by mouth every other day.     hydroxychloroquine (PLAQUENIL) 200 MG tablet Take 400 mg by mouth daily.     levothyroxine (SYNTHROID) 100 MCG tablet Take 1 tablet (100 mcg total) by mouth daily before breakfast. 30 tablet 2   Lifitegrast (XIIDRA) 5 % SOLN Place 1 drop into both eyes daily.     losartan (COZAAR) 100 MG tablet Take 100 mg by mouth daily.     metoprolol tartrate (LOPRESSOR) 50 MG tablet Take 25 mg by mouth 2 (two) times daily.     montelukast (SINGULAIR) 10 MG tablet Take 1 tablet by mouth once daily 90 tablet 0   Multiple Vitamin (MULTIVITAMIN ADULT PO) Take 1 tablet by mouth every other day.     pantoprazole (PROTONIX) 40 MG tablet Take 40 mg by mouth daily.     polyethylene glycol-electrolytes (TRILYTE) 420 g solution Take 4,000 mLs by mouth as directed. 4000 mL  0   clobetasol cream (TEMOVATE) 6.96 % Apply 1 Application topically 2 (two) times daily as needed (irritation).     diclofenac sodium (VOLTAREN) 1 % GEL Apply 2 g topically 4 (four) times daily. Prn pain (Patient taking differently: Apply 2 g topically 4 (four) times daily as needed (pain).) 100 g 0    No results found for this or any previous visit (from the past 48 hour(s)). No results found.  Review of Systems  All other systems reviewed and are negative.   Blood pressure 139/78, pulse 88, temperature (!) 97.5 F (36.4 C), temperature source Oral, resp. rate 18, height '5\' 2"'$  (1.575 m), weight 90.3 kg, SpO2 97 %. Physical Exam  GENERAL: The patient is AO x3, in no acute distress. HEENT: Head is normocephalic and atraumatic. EOMI are intact. Mouth is well hydrated and  without lesions. NECK: Supple. No masses LUNGS: Clear to auscultation. No presence of rhonchi/wheezing/rales. Adequate chest expansion HEART: RRR, normal s1 and s2. ABDOMEN: Soft, nontender, no guarding, no peritoneal signs, and nondistended. BS +. No masses. EXTREMITIES: Without any cyanosis, clubbing, rash, lesions or edema. NEUROLOGIC: AOx3, no focal motor deficit. SKIN: no jaundice, no rashes  Assessment/Plan 66 year old female with past medical historyArthritis, anxiety, hypothyroidism, connective tissue disorder, coming for screening colonoscopy. The patient is at average risk for colorectal cancer.  We will proceed with colonoscopy today.  Harvel Quale, MD 04/11/2022, 11:37 AM

## 2022-04-11 NOTE — Anesthesia Preprocedure Evaluation (Signed)
Anesthesia Evaluation  Patient identified by MRN, date of birth, ID band Patient awake    Reviewed: Allergy & Precautions, H&P , NPO status , Patient's Chart, lab work & pertinent test results, reviewed documented beta blocker date and time   Airway Mallampati: II  TM Distance: >3 FB Neck ROM: full    Dental no notable dental hx.    Pulmonary neg pulmonary ROS   Pulmonary exam normal breath sounds clear to auscultation       Cardiovascular Exercise Tolerance: Good hypertension, negative cardio ROS  Rhythm:regular Rate:Normal     Neuro/Psych  PSYCHIATRIC DISORDERS Anxiety     negative neurological ROS     GI/Hepatic Neg liver ROS,GERD  ,,  Endo/Other  diabetesHypothyroidism    Renal/GU negative Renal ROS  negative genitourinary   Musculoskeletal   Abdominal   Peds  Hematology negative hematology ROS (+)   Anesthesia Other Findings BMI 36.4  Reproductive/Obstetrics negative OB ROS                              Anesthesia Physical Anesthesia Plan  ASA: 2  Anesthesia Plan: General   Post-op Pain Management:    Induction:   PONV Risk Score and Plan:   Airway Management Planned:   Additional Equipment:   Intra-op Plan:   Post-operative Plan:   Informed Consent: I have reviewed the patients History and Physical, chart, labs and discussed the procedure including the risks, benefits and alternatives for the proposed anesthesia with the patient or authorized representative who has indicated his/her understanding and acceptance.     Dental Advisory Given  Plan Discussed with: CRNA  Anesthesia Plan Comments:          Anesthesia Quick Evaluation

## 2022-04-13 LAB — SURGICAL PATHOLOGY

## 2022-04-17 ENCOUNTER — Encounter (HOSPITAL_COMMUNITY): Payer: Self-pay | Admitting: Gastroenterology

## 2022-05-10 ENCOUNTER — Other Ambulatory Visit: Payer: Self-pay | Admitting: "Endocrinology

## 2022-05-24 ENCOUNTER — Ambulatory Visit: Payer: Medicare Other | Admitting: "Endocrinology

## 2022-07-14 LAB — T4, FREE: Free T4: 1.53 ng/dL (ref 0.82–1.77)

## 2022-07-14 LAB — THYROGLOBULIN ANTIBODY: Thyroglobulin Antibody: <1 IU/mL (ref 0.0–0.9)

## 2022-07-14 LAB — TSH: TSH: 2.92 u[IU]/mL (ref 0.450–4.500)

## 2022-07-14 LAB — THYROID PEROXIDASE ANTIBODY: Thyroperoxidase Ab SerPl-aCnc: 11 IU/mL (ref 0–34)

## 2022-07-15 ENCOUNTER — Other Ambulatory Visit: Payer: Self-pay | Admitting: "Endocrinology

## 2022-08-13 ENCOUNTER — Other Ambulatory Visit: Payer: Self-pay | Admitting: "Endocrinology

## 2022-08-27 ENCOUNTER — Other Ambulatory Visit: Payer: Self-pay | Admitting: "Endocrinology

## 2022-08-29 ENCOUNTER — Telehealth: Payer: Self-pay | Admitting: "Endocrinology

## 2022-08-29 MED ORDER — LEVOTHYROXINE SODIUM 100 MCG PO TABS: 100.0000 ug | ORAL_TABLET | Freq: Every day | ORAL | 0 refills | Status: DC

## 2022-08-29 NOTE — Telephone Encounter (Signed)
Pt is asking for a refill on Levothyroxine to get her to her appt on 4/24. Thank yu

## 2022-08-29 NOTE — Telephone Encounter (Signed)
30 day supply sent to pharmacy.  

## 2022-09-12 ENCOUNTER — Telehealth: Payer: Self-pay | Admitting: "Endocrinology

## 2022-09-12 ENCOUNTER — Other Ambulatory Visit: Payer: Self-pay | Admitting: "Endocrinology

## 2022-09-12 DIAGNOSIS — E039 Hypothyroidism, unspecified: Secondary | ICD-10-CM

## 2022-09-12 NOTE — Telephone Encounter (Signed)
Pt notified. She will have labs done before 4/24

## 2022-09-12 NOTE — Telephone Encounter (Signed)
Unsure why pt had thyroid labs on 2/16 but she has an appt on 4/24. She is asking if they can be used for her appt next week?

## 2022-09-14 LAB — T4, FREE: Free T4: 1.48 ng/dL (ref 0.82–1.77)

## 2022-09-14 LAB — T3, FREE: T3, Free: 2.7 pg/mL (ref 2.0–4.4)

## 2022-09-14 LAB — TSH: TSH: 2.7 u[IU]/mL (ref 0.450–4.500)

## 2022-09-19 ENCOUNTER — Encounter: Payer: Self-pay | Admitting: "Endocrinology

## 2022-09-19 ENCOUNTER — Ambulatory Visit: Payer: Medicare Other | Admitting: "Endocrinology

## 2022-09-19 VITALS — BP 108/64 | HR 72 | Ht 62.0 in | Wt 210.6 lb

## 2022-09-19 DIAGNOSIS — E039 Hypothyroidism, unspecified: Secondary | ICD-10-CM | POA: Diagnosis not present

## 2022-09-19 MED ORDER — LEVOTHYROXINE SODIUM 100 MCG PO TABS: 100.0000 ug | ORAL_TABLET | Freq: Every day | ORAL | 3 refills | Status: DC

## 2022-09-19 NOTE — Progress Notes (Signed)
09/19/2022, 6:32 PM   Endocrinology follow-up note  Subjective:    Patient ID: Carolyn Stone, female    DOB: 1955/07/11, PCP Bucio, Julian Reil, FNP   Past Medical History:  Diagnosis Date   Allergy    Anxiety    Arthritis    Connective tissue disorder    Dysphagia 12/25/2016   Hypertension    Hypothyroidism    Myalgia and myositis    "undetermined inflammatory process"   Past Surgical History:  Procedure Laterality Date   Bilateral foot surgery     CARPAL TUNNEL RELEASE Bilateral    CESAREAN SECTION     COLONOSCOPY WITH PROPOFOL N/A 04/11/2022   Procedure: COLONOSCOPY WITH PROPOFOL;  Surgeon: Dolores Frame, MD;  [facility]: [facility];  Service: Gastroenterology;  Laterality: N/A;  1045 ASA 2   DILATION AND CURETTAGE OF UTERUS     x2   FOOT SURGERY Left    POLYPECTOMY  04/11/2022   Procedure: POLYPECTOMY;  Surgeon: Marguerita Merles, Reuel Boom, MD;  Location: [facility];  Service: Gastroenterology;;   Social History   Socioeconomic History   Marital status: Married    Spouse name: Roger Shelter   Number of children: 2   Years of education: 16   Highest education level: Not on file  Occupational History   Occupation: teacher-part-time    Comment: GED classes  Tobacco Use   Smoking status: Never   Smokeless tobacco: Never  Vaping Use   Vaping Use: Never used  Substance and Sexual Activity   Alcohol use: No   Drug use: No   Sexual activity: Yes    Partners: Male    Birth control/protection: Post-menopausal    Comment: husband s/p vasectomy  Other Topics Concern   Not on file  Social History Narrative   Lives with her husband of 36 years.  Their 2 daughters are grown and live independently.Retired Runner, broadcasting/film/video.   Social Determinants of Health   Financial Resource Strain: Not on file  Food Insecurity: Not on file  Transportation Needs: Not on file  Physical Activity: Not on file  Stress: Not on  file  Social Connections: Not on file   Family History  Problem Relation Age of Onset   Cancer Mother        squamous cell-larygopharyngeal-died age 22   Cancer Father        lung/liver cancer- died age 72   Mental illness Father        ?schizophrenia   Mental illness Sister        Bipolar Disorder   Hypertension Brother    Cancer Maternal Grandmother    Hypertension Paternal Grandmother    Hyperlipidemia Paternal Grandmother    Stroke Paternal Grandfather    Colon cancer Maternal Uncle    Outpatient Encounter Medications as of 09/19/2022  Medication Sig   acetaminophen (TYLENOL) 650 MG CR tablet Take 1,300 mg by mouth every 8 (eight) hours as needed for pain.   amLODipine (NORVASC) 10 MG tablet Take 10 mg by mouth daily.   BIOTIN PO Take 1 tablet by mouth every other day.   Cholecalciferol (VITAMIN D3) 125 MCG (5000 UT) TABS Take 10,000 Units by mouth every other day.  clobetasol cream (TEMOVATE) 0.05 % Apply 1 Application topically 2 (two) times daily as needed (irritation).   diclofenac sodium (VOLTAREN) 1 % GEL Apply 2 g topically 4 (four) times daily. Prn pain (Patient taking differently: Apply 2 g topically 4 (four) times daily as needed (pain).)   hydroxychloroquine (PLAQUENIL) 200 MG tablet Take 400 mg by mouth daily.   levothyroxine (SYNTHROID) 100 MCG tablet Take 1 tablet (100 mcg total) by mouth daily before breakfast.   Lifitegrast (XIIDRA) 5 % SOLN Place 1 drop into both eyes daily.   losartan (COZAAR) 100 MG tablet Take 100 mg by mouth daily.   metoprolol tartrate (LOPRESSOR) 50 MG tablet Take 50 mg by mouth 2 (two) times daily.   montelukast (SINGULAIR) 10 MG tablet Take 1 tablet by mouth once daily   Multiple Vitamin (MULTIVITAMIN ADULT PO) Take 1 tablet by mouth every other day.   pantoprazole (PROTONIX) 40 MG tablet Take 40 mg by mouth daily.   [DISCONTINUED] levothyroxine (SYNTHROID) 100 MCG tablet Take 1 tablet (100 mcg total) by mouth daily before breakfast.    [DISCONTINUED] progesterone (PROMETRIUM) 200 MG capsule Take 1 capsule by mouth once daily   No facility-administered encounter medications on file as of 09/19/2022.   ALLERGIES: Allergies  Allergen Reactions   Bee Venom Other (See Comments) and Swelling   Macrodantin [Nitrofurantoin Macrocrystal] Nausea And Vomiting and Nausea Only   Nitrofurantoin Other (See Comments)    REACTION: weakness, vomiting, fainting    VACCINATION STATUS: Immunization History  Administered Date(s) Administered   Influenza Whole 03/26/2008   Influenza,inj,Quad PF,6+ Mos 06/15/2015, 05/18/2019   Influenza,inj,quad, With Preservative 03/15/2017   Influenza-Unspecified 03/01/2020   Moderna Sars-Covid-2 Vaccination 07/27/2019, 08/27/2019, 04/11/2020   Tdap 11/20/2013    HPI Carolyn Stone is 67 y.o. female who presents today with a medical history as above. she is being seen in follow-up after she was seen in consultation for hypothyroidism  requested by Bucio, Julian Reil, FNP.  History is obtained directly from the patient as well as chart review.  Reportedly, she was diagnosed with hypothyroidism at approximate age of 35 years.  She was started on NP thyroid at times up to 150 mg daily.   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  She was switched to levothyroxine at 100 mcg p.o. daily and she continues to feel better.  Her previous symptoms of palpitation, anxiety and tremors have largely disappeared.   Her previsit thyroid function tests are consistent with appropriate replacement. She remains on propranolol 50 mg p.o. twice daily. Her other medical problems include hypertension, IBS, myositis, arthralgias/arthritis, connective tissue disorders.  She denies dysphagia, shortness of breath, nor voice change. Review of Systems  Constitutional: + Gained approximately 10 pounds since  November 2023.     Objective:       09/19/2022    2:41 PM 04/11/2022   12:09 PM 04/11/2022    9:41 AM  Vitals with BMI  Height     Weight 210 lbs 10 oz    BMI 38.51    Systolic 108 117 161  Diastolic 64 58 78  Pulse 72 88 88    BP 108/64   Pulse 72   Ht  (1.575 m)   Wt 210 lb 9.6 oz (95.5 kg)   BMI 38.52 kg/m   Wt Readings from Last 3 Encounters:  09/19/22 210 lb 9.6 oz (95.5 kg)  04/11/22 199 lb (90.3 kg)  02/21/22 203 lb 6.4 oz (92.3 kg)    Physical Exam  Constitutional:  Body mass index is 38.52 kg/m.,  not in acute distress, normal state of mind Eyes: PERRLA, EOMI, no exophthalmos ENT: moist mucous membranes, + palpable thyromegaly, no gross cervical lymphadenopathy Cardiovascular: normal precordial activity, Regular Rate and Rhythm, no Murmur/Rubs/Gallops   CMP ( most recent) CMP     Component Value Date/Time   NA 141 10/20/2021 0000   K 4.1 10/20/2021 0000   CL 107 10/20/2021 0000   CO2 25 (A) 10/20/2021 0000   GLUCOSE 94 10/27/2020 1526   BUN 13 10/20/2021 0000   CREATININE 0.7 10/20/2021 0000   CREATININE 0.84 10/27/2020 1526   CALCIUM 9.2 10/20/2021 0000   PROT 7.2 10/27/2020 1526   PROT 7.7 08/02/2016 1001   ALBUMIN 3.9 10/20/2021 0000   ALBUMIN 4.4 08/02/2016 1001   AST 18 10/20/2021 0000   ALT 30 (H) 10/27/2020 1526   ALKPHOS 30 10/20/2021 0000   BILITOT 0.6 10/27/2020 1526   BILITOT 0.8 08/02/2016 1001   GFRNONAA 73 10/27/2020 1526   GFRAA 85 10/27/2020 1526     Diabetic Labs (most recent): Lab Results  Component Value Date   HGBA1C 5.6 10/20/2021   HGBA1C 5.4 08/03/2020   HGBA1C 5.5 07/07/2019   MICROALBUR 0.9 08/03/2020   MICROALBUR 0.50 08/18/2008     Lipid Panel ( most recent) Lipid Panel     Component Value Date/Time   CHOL 150 10/20/2021 0000   CHOL 218 (H) 08/02/2016 1001   TRIG 150 10/20/2021 0000   HDL 39 10/20/2021 0000   HDL 43 08/02/2016 1001   CHOLHDL 4.0 08/03/2020 1531   VLDL 25 05/07/2014 1111    LDLCALC 86 10/20/2021 0000   LDLCALC 113 (H) 08/03/2020 1531   LABVLDL 33 08/02/2016 1001      Lab Results  Component Value Date   TSH 2.700 09/13/2022   TSH 2.920 07/13/2022   TSH 0.38 (A) 10/20/2021   TSH 0.10 (L) 10/27/2020   TSH 0.43 08/03/2020   TSH 2.28 07/07/2019   FREET4 1.48 09/13/2022   FREET4 1.53 07/13/2022   FREET4 1.0 08/03/2020      Assessment & Plan:   1. Hypothyroidism, unspecified type  -She did have 2 sets of thyroid function test since last visit consistent with appropriate replacement.  She is doing better on levothyroxine compared to when she was on high-dose NP thyroid.  She is advised to continue levothyroxine 100 mcg p.o. daily before breakfast.   - We discussed about the correct intake of her thyroid hormone, on empty stomach at fasting, with water, separated by at least 30 minutes from breakfast and other medications,  and separated by more than 4 hours from calcium, iron, multivitamins, acid reflux medications (PPIs). -Patient is made aware of the fact that thyroid hormone replacement is needed for life, dose to be adjusted by periodic monitoring of thyroid function tests.  We will be considered for baseline thyroid ultrasound after her next visit.   Her pulse rate today is 72.  She reports that her cardiologist increased her metoprolol to 50 mg p.o. twice daily.     she is advised to maintain close follow up with Bucio, Julian Reil, FNP for primary care needs.  I spent  22  minutes in the care of the patient today including review of labs from Thyroid Function, CMP, and other relevant labs ; imaging/biopsy records (current and previous including abstractions  from other facilities); face-to-face time discussing  her lab results and symptoms, medications doses, her options of short and long term treatment based on the latest standards of care / guidelines;   and documenting the encounter.  Carolyn Stone  participated in the discussions, expressed  understanding, and voiced agreement with the above plans.  All questions were answered to her satisfaction. she is encouraged to contact clinic should she have any questions or concerns prior to her return visit.   Follow up plan: Return in about 1 year (around 09/19/2023) for F/U with Pre-visit Labs.   Marquis Lunch, MD [facility] [address] [place], [place] [address] Phone: [phone]  Fax: [phone]     09/19/2022, 6:32 PM  This note was partially dictated with voice recognition software. Similar sounding words can be transcribed inadequately or may not  be corrected upon review.

## 2022-10-31 DIAGNOSIS — E782 Mixed hyperlipidemia: Secondary | ICD-10-CM | POA: Insufficient documentation

## 2022-10-31 DIAGNOSIS — M359 Systemic involvement of connective tissue, unspecified: Secondary | ICD-10-CM | POA: Insufficient documentation

## 2022-10-31 DIAGNOSIS — F419 Anxiety disorder, unspecified: Secondary | ICD-10-CM | POA: Insufficient documentation

## 2022-10-31 NOTE — Progress Notes (Unsigned)
History of Present Illness: Carolyn Stone is a 67 y.o. female who presents today as a new patient at [facility] [place]. All available relevant medical records have been reviewed.  - Past medical history includes ***HTN, ***HLD, ***hypothyroidism, ***T2DM ***with neuropathy, ***asthma, ***COPD, ***OSA (***on CPAP), ***CKD stage ***, ***GERD, ***osteoporosis/***osteopenia, ***osteoarthritis, ***chronic back pain, ***fibromyalgia, ***migraines, ***obesity, ***anxiety, ***depression. ***Current ***Former smoker.  - GU/GYN History: 1. Vaginal atrophy. ***Using Intrarosa vaginal estrogen suppositories.  She reports chief complaint of ***  She reports urinary ***frequency and ***urgency. She reports voiding *** times per day and *** times at night. She {Actions; denies-reports:120008} urge incontinence with {DESC; SMALL/MODERATE/LARGE:19669} volume urine leakage *** times per ***day. Wears *** ***pads/***diapers per day.  She {Actions; denies-reports:120008} dysuria, gross hematuria, straining to void, or sensations of incomplete emptying.   Fall Screening: Do you usually have a device to assist in your mobility? {yes/no:20286} ***cane / ***walker / ***wheelchair  Medications: Current Outpatient Medications  Medication Sig Dispense Refill   acetaminophen (TYLENOL) 650 MG CR tablet Take 1,300 mg by mouth every 8 (eight) hours as needed for pain.     amLODipine (NORVASC) 10 MG tablet Take 10 mg by mouth daily.     BIOTIN PO Take 1 tablet by mouth every other day.     Cholecalciferol (VITAMIN D3) 125 MCG (5000 UT) TABS Take 10,000 Units by mouth every other day.     clobetasol cream (TEMOVATE) 0.05 % Apply 1 Application topically 2 (two) times daily as needed (irritation).     diclofenac sodium (VOLTAREN) 1 % GEL Apply 2 g topically 4 (four) times daily. Prn pain (Patient taking differently: Apply 2 g topically 4 (four) times daily as needed (pain).) 100 g 0   hydroxychloroquine  (PLAQUENIL) 200 MG tablet Take 400 mg by mouth daily.     levothyroxine (SYNTHROID) 100 MCG tablet Take 1 tablet (100 mcg total) by mouth daily before breakfast. 90 tablet 3   Lifitegrast (XIIDRA) 5 % SOLN Place 1 drop into both eyes daily.     losartan (COZAAR) 100 MG tablet Take 100 mg by mouth daily.     metoprolol tartrate (LOPRESSOR) 50 MG tablet Take 50 mg by mouth 2 (two) times daily.     montelukast (SINGULAIR) 10 MG tablet Take 1 tablet by mouth once daily 90 tablet 0   Multiple Vitamin (MULTIVITAMIN ADULT PO) Take 1 tablet by mouth every other day.     pantoprazole (PROTONIX) 40 MG tablet Take 40 mg by mouth daily.     No current facility-administered medications for this visit.    Allergies: Allergies  Allergen Reactions   Bee Venom Other (See Comments) and Swelling   Macrodantin [Nitrofurantoin Macrocrystal] Nausea And Vomiting and Nausea Only   Nitrofurantoin Other (See Comments)    REACTION: weakness, vomiting, fainting    Past Medical History:  Diagnosis Date   Allergy    Anxiety    Arthritis    Connective tissue disorder (HCC)    Dysphagia 12/25/2016   Hypertension    Hypothyroidism    Myalgia and myositis    "undetermined inflammatory process"   Past Surgical History:  Procedure Laterality Date   Bilateral foot surgery     CARPAL TUNNEL RELEASE Bilateral    CESAREAN SECTION     COLONOSCOPY WITH PROPOFOL N/A 04/11/2022   Procedure: COLONOSCOPY WITH PROPOFOL;  Surgeon: Dolores Frame, MD;  [facility];  Service: Gastroenterology;  Laterality: N/A;  1045 ASA 2  DILATION AND CURETTAGE OF UTERUS     x2   FOOT SURGERY Left    POLYPECTOMY  04/11/2022   Procedure: POLYPECTOMY;  Surgeon: Marguerita Merles, Reuel Boom, MD;  Location: [facility];  Service: Gastroenterology;;    Family History  Problem Relation Age of Onset   Cancer Mother        squamous cell-larygopharyngeal-died age 69   Cancer Father        lung/liver cancer- died age  34   Mental illness Father        ?schizophrenia   Mental illness Sister        Bipolar Disorder   Hypertension Brother    Cancer Maternal Grandmother    Hypertension Paternal Grandmother    Hyperlipidemia Paternal Grandmother    Stroke Paternal Grandfather    Colon cancer Maternal Uncle    Social History   Socioeconomic History   Marital status: Married    Spouse name: Carolyn Stone   Number of children: 2   Years of education: 16   Highest education level: Not on file  Occupational History   Occupation: teacher-part-time    Comment: GED classes  Tobacco Use   Smoking status: Never   Smokeless tobacco: Never  Vaping Use   Vaping Use: Never used  Substance and Sexual Activity   Alcohol use: No   Drug use: No   Sexual activity: Yes    Partners: Male    Birth control/protection: Post-menopausal    Comment: husband s/p vasectomy  Other Topics Concern   Not on file  Social History Narrative   Lives with her husband of 36 years.  Their 2 daughters are grown and live independently.Retired Runner, broadcasting/film/video.   Social Determinants of Health   Financial Resource Strain: Not on file  Food Insecurity: Not on file  Transportation Needs: Not on file  Physical Activity: Not on file  Stress: Not on file  Social Connections: Not on file  Intimate Partner Violence: Not on file    SUBJECTIVE  Review of Systems Constitutional: Patient ***denies any unintentional weight loss or change in strength lntegumentary: Patient ***denies any rashes or pruritus Eyes: Patient denies ***dry eyes ENT: Patient ***denies dry mouth Cardiovascular: Patient ***denies chest pain or syncope Respiratory: Patient ***denies shortness of breath Gastrointestinal: Patient ***denies nausea, vomiting, constipation, or diarrhea Musculoskeletal: Patient ***denies muscle cramps or weakness Neurologic: Patient ***denies convulsions or seizures Psychiatric: Patient ***denies memory problems Allergic/Immunologic: Patient  ***denies recent allergic reaction(s) Hematologic/Lymphatic: Patient denies bleeding tendencies Endocrine: Patient ***denies heat/cold intolerance  GU: As per HPI.  OBJECTIVE There were no vitals filed for this visit. There is no height or weight on file to calculate BMI.  Physical Examination  Constitutional: ***No obvious distress; patient is ***non-toxic appearing  Cardiovascular: ***No visible lower extremity edema.  Respiratory: The patient does ***not have audible wheezing/stridor; respirations do ***not appear labored  Gastrointestinal: Abdomen ***non-distended Musculoskeletal: ***Normal ROM of UEs  Skin: ***No obvious rashes/open sores  Neurologic: CN 2-12 grossly ***intact Psychiatric: Answered questions ***appropriately with ***normal affect  Hematologic/Lymphatic/Immunologic: ***No obvious bruises or sites of spontaneous bleeding  UA: {Desc; negative/positive:13464} *** WBC/hpf, *** RBC/hpf, bacteria (***) *** nitrites, *** leukocytes, *** blood PVR: *** ml  ASSESSMENT No diagnosis found. ***  Will plan for follow up in *** months or sooner if needed. Pt verbalized understanding and agreement. All questions were answered.  PLAN Advised the following: *** ***No follow-ups on file.  No orders of the defined types were placed in this encounter.   It  has been explained that the patient is to follow regularly with their PCP in addition to all other providers involved in their care and to follow instructions provided by these respective offices. Patient advised to contact urology clinic if any urologic-pertaining questions, concerns, new symptoms or problems arise in the interim period.  There are no Patient Instructions on file for this visit.  Electronically signed by:  Donnita Falls, MSN, FNP-C, CUNP 10/31/2022 2:24 PM

## 2022-11-01 ENCOUNTER — Ambulatory Visit: Payer: Medicare Other | Admitting: Urology

## 2022-11-01 ENCOUNTER — Encounter: Payer: Self-pay | Admitting: Urology

## 2022-11-01 VITALS — BP 134/83 | HR 81 | Temp 98.5°F

## 2022-11-01 DIAGNOSIS — N952 Postmenopausal atrophic vaginitis: Secondary | ICD-10-CM

## 2022-11-01 DIAGNOSIS — R351 Nocturia: Secondary | ICD-10-CM

## 2022-11-01 DIAGNOSIS — N3281 Overactive bladder: Secondary | ICD-10-CM

## 2022-11-01 DIAGNOSIS — G4733 Obstructive sleep apnea (adult) (pediatric): Secondary | ICD-10-CM

## 2022-11-01 LAB — URINALYSIS, ROUTINE W REFLEX MICROSCOPIC
Bilirubin, UA: NEGATIVE
Glucose, UA: NEGATIVE
Ketones, UA: NEGATIVE
Leukocytes,UA: NEGATIVE
Nitrite, UA: NEGATIVE
Protein,UA: NEGATIVE
RBC, UA: NEGATIVE
Specific Gravity, UA: 1.02 (ref 1.005–1.030)
Urobilinogen, Ur: 0.2 mg/dL (ref 0.2–1.0)
pH, UA: 5.5 (ref 5.0–7.5)

## 2023-04-24 NOTE — Progress Notes (Unsigned)
Name: Carolyn Stone DOB: 03-Feb-1956 MRN: [id number]  History of Present Illness: Carolyn Stone is a 67 y.o. female who presents today for follow up visit at [facility] [place]. - GU history: 1. Nocturia. 2. Stress urinary incontinence (mild). Occurs occasionally; not significantly bothersome per patient.  3. Vaginal atrophy. Was previously using Intrarosa vaginal estrogen suppositories but insurance will no longer cover that; plan was to start topical vaginal estrogen cream.   At initial visit on 11/01/2022: - Reported nocturia x5-6.  - Exacerbating factors: caffeine intake (coffee and tea; patient trying to reduce), fluid intake within 3 hours prior to bedtime, BLE edema, and untreated OSA (was in the process of getting a CPAP mask that fits).  - The plan was:  Restart CPAP use for OSA management. ***did she? Behavioral management (minimize caffeine intake, minimize fluid intake within 3 hours before bedtime, reduce salt / sodium intake, wear compression socks, elevated BLE). Return in about 6 months (around 05/03/2023) for UA & f/u with Evette Georges NP.  Since last visit: ***  Today: She reports ***  She reports {Blank multiple:19197::"improved","persistent / unchanged"} urinary ***frequency, ***nocturia, ***urgency, and ***urge incontinence. Voiding ***x/day and ***x/night on average. Leaking ***x/day on average; using *** ***pads / ***diapers per day on average.  She {Actions; denies-reports:120008} caffeine intake (*** caffeinated beverages per day on average).  She {Actions; denies-reports:120008} dysuria, gross hematuria, straining to void, or sensations of incomplete emptying.  She {HAS HAS NWG:95621} been using vaginal estrogen cream at a frequency of *** time(s) per week. She {Actions; denies-reports:120008} vaginal pain, bleeding, discharge.   Fall Screening: Do you usually have a device to assist in your mobility? {yes/no:20286} ***cane / ***walker /  ***wheelchair   Medications: Current Outpatient Medications  Medication Sig Dispense Refill   acetaminophen (TYLENOL) 650 MG CR tablet Take 1,300 mg by mouth every 8 (eight) hours as needed for pain.     amLODipine (NORVASC) 10 MG tablet Take 10 mg by mouth daily.     BIOTIN PO Take 1 tablet by mouth every other day.     Cholecalciferol (VITAMIN D3) 125 MCG (5000 UT) TABS Take 10,000 Units by mouth every other day.     clobetasol cream (TEMOVATE) 0.05 % Apply 1 Application topically 2 (two) times daily as needed (irritation).     diclofenac sodium (VOLTAREN) 1 % GEL Apply 2 g topically 4 (four) times daily. Prn pain (Patient taking differently: Apply 2 g topically 4 (four) times daily as needed (pain).) 100 g 0   hydroxychloroquine (PLAQUENIL) 200 MG tablet Take 400 mg by mouth daily.     levothyroxine (SYNTHROID) 100 MCG tablet Take 1 tablet (100 mcg total) by mouth daily before breakfast. 90 tablet 3   Lifitegrast (XIIDRA) 5 % SOLN Place 1 drop into both eyes daily.     losartan (COZAAR) 100 MG tablet Take 100 mg by mouth daily.     metoprolol tartrate (LOPRESSOR) 50 MG tablet Take 50 mg by mouth 2 (two) times daily.     montelukast (SINGULAIR) 10 MG tablet Take 1 tablet by mouth once daily 90 tablet 0   Multiple Vitamin (MULTIVITAMIN ADULT PO) Take 1 tablet by mouth every other day.     pantoprazole (PROTONIX) 40 MG tablet Take 40 mg by mouth daily.     No current facility-administered medications for this visit.    Allergies: Allergies  Allergen Reactions   Bee Venom Other (See Comments) and Swelling   Macrodantin [Nitrofurantoin Macrocrystal] Nausea And  Vomiting and Nausea Only   Nitrofurantoin Other (See Comments)    REACTION: weakness, vomiting, fainting    Past Medical History:  Diagnosis Date   Allergy    Anxiety    Arthritis    Connective tissue disorder (HCC)    Dysphagia 12/25/2016   Hypertension    Hypothyroidism    Myalgia and myositis    "undetermined  inflammatory process"   Past Surgical History:  Procedure Laterality Date   Bilateral foot surgery     CARPAL TUNNEL RELEASE Bilateral    CESAREAN SECTION     COLONOSCOPY WITH PROPOFOL N/A 04/11/2022   Procedure: COLONOSCOPY WITH PROPOFOL;  Surgeon: Dolores Frame, MD;  [facility]: [facility];  Service: Gastroenterology;  Laterality: N/A;  1045 ASA 2   DILATION AND CURETTAGE OF UTERUS     x2   FOOT SURGERY Left    POLYPECTOMY  04/11/2022   Procedure: POLYPECTOMY;  Surgeon: Marguerita Merles, Reuel Boom, MD;  Location: [facility];  Service: Gastroenterology;;   Family History  Problem Relation Age of Onset   Cancer Mother        squamous cell-larygopharyngeal-died age 14   Cancer Father        lung/liver cancer- died age 64   Mental illness Father        ?schizophrenia   Mental illness Sister        Bipolar Disorder   Hypertension Brother    Cancer Maternal Grandmother    Hypertension Paternal Grandmother    Hyperlipidemia Paternal Grandmother    Stroke Paternal Grandfather    Colon cancer Maternal Uncle    Social History   Socioeconomic History   Marital status: Married    Spouse name: Roger Shelter   Number of children: 2   Years of education: 16   Highest education level: Not on file  Occupational History   Occupation: teacher-part-time    Comment: GED classes  Tobacco Use   Smoking status: Never   Smokeless tobacco: Never  Vaping Use   Vaping status: Never Used  Substance and Sexual Activity   Alcohol use: No   Drug use: No   Sexual activity: Yes    Partners: Male    Birth control/protection: Post-menopausal    Comment: husband s/p vasectomy  Other Topics Concern   Not on file  Social History Narrative   Lives with her husband of 36 years.  Their 2 daughters are grown and live independently.Retired Runner, broadcasting/film/video.   Social Determinants of Health   Financial Resource Strain: Not on file  Food Insecurity: Not on file  Transportation Needs: Not on file   Physical Activity: Not on file  Stress: Not on file  Social Connections: Not on file  Intimate Partner Violence: Not on file    Review of Systems*** Constitutional: Patient denies any unintentional weight loss or change in strength lntegumentary: Patient denies any rashes or pruritus Eyes: Patient {Actions; denies-reports:120008} dry eyes ENT: Patient {Actions; denies-reports:120008} dry mouth Cardiovascular: Patient denies chest pain or syncope Respiratory: Patient denies shortness of breath Gastrointestinal: Patient ***denies nausea, vomiting, constipation, or diarrhea Musculoskeletal: Patient denies muscle cramps or weakness Neurologic: Patient denies convulsions or seizures Allergic/Immunologic: Patient denies recent allergic reaction(s) Hematologic/Lymphatic: Patient denies bleeding tendencies Endocrine: Patient denies heat/cold intolerance  GU: As per HPI.  OBJECTIVE There were no vitals filed for this visit. There is no height or weight on file to calculate BMI.  Physical Examination*** Constitutional: No obvious distress; patient is non-toxic appearing  Cardiovascular: No visible lower  extremity edema.  Respiratory: The patient does not have audible wheezing/stridor; respirations do not appear labored  Gastrointestinal: Abdomen non-distended Musculoskeletal: Normal ROM of UEs  Skin: No obvious rashes/open sores  Neurologic: CN 2-12 grossly intact Psychiatric: Answered questions appropriately with normal affect  Hematologic/Lymphatic/Immunologic: No obvious bruises or sites of spontaneous bleeding  UA: ***negative *** WBC/hpf, *** RBC/hpf, *** bacteria ***with no evidence of UTI ***with no evidence of microscopic hematuria PVR: *** ml  ASSESSMENT No diagnosis found. *** We discussed the symptoms of overactive bladder (OAB), which include urinary urgency, frequency, nocturia, with or without urge incontinence.   While we may not know the exact etiology of OAB,  several risk factors can be identified.  - We discussed this patient's neurogenic risk factors for OAB-type symptoms including ***T2DM, ***nicotine use, ***.  - Likely exacerbated by ***diuretic use, ***caffeine intake, ***consumption of bladder irritants such as (acidic foods, spicy foods, alcohol).   We discussed the following management options in detail including potential benefits, risks, and side effects: Behavioral therapy: Modify fluid intake Decreasing bladder irritants (such as caffeine, acidic foods, spicy foods, alcohol) Urge suppression strategies Bladder retraining / timed voiding Double voiding Medication(s): - For anticholinergic medications, we discussed the potential side effects of anticholinergics including dry eyes, dry mouth, constipation, cognitive impairment and urinary retention.  - For beta-3 agonist medication, we discussed the risk for urinary retention and the potential side effect of elevated blood pressure specific to Myrbetriq (which is more likely to occur in individuals with uncontrolled hypertension).  For refractory cases: PTNS (posterior tibial nerve stimulation) Sacral neuromodulation trial (Medtronic lnterStim or Axonics implant) Bladder Botox injections In extreme cases, SP tube placement  ***In order to further evaluate urinary incontinence, She was instructed to complete a 3-day bladder diary.  ***Discussed recommendation for urodynamic testing, which was described in detail including risks such as bleeding, infection, organ/tissue/nerve damage.  She decided to proceed with *** ***work on behavioral modifications including ***minimizing caffeine intake and working on ***timed voiding.  Will plan for follow up in ***8 weeks / *** months / ***1 year or sooner if needed. Pt verbalized understanding and agreement. All questions were answered.  PLAN Advised the following: *** ***. Minimize caffeine intake. ***. Work on timed voiding. ***. Urge  suppression strategies. ***. Double/ triple voiding. ***. No follow-ups on file.  No orders of the defined types were placed in this encounter.   It has been explained that the patient is to follow regularly with their PCP in addition to all other providers involved in their care and to follow instructions provided by these respective offices. Patient advised to contact urology clinic if any urologic-pertaining questions, concerns, new symptoms or problems arise in the interim period.  There are no Patient Instructions on file for this visit.  Electronically signed by:  Donnita Falls, FNP   04/24/23    10:13 AM

## 2023-04-30 ENCOUNTER — Ambulatory Visit: Payer: Medicare Other | Admitting: Urology

## 2023-04-30 DIAGNOSIS — R351 Nocturia: Secondary | ICD-10-CM

## 2023-05-27 ENCOUNTER — Ambulatory Visit: Payer: Medicare Other | Admitting: Podiatry

## 2023-06-05 ENCOUNTER — Ambulatory Visit: Payer: Medicare Other | Admitting: Podiatry

## 2023-06-10 ENCOUNTER — Ambulatory Visit: Payer: Medicare Other | Admitting: Podiatry

## 2023-06-11 ENCOUNTER — Ambulatory Visit: Payer: Medicare Other | Admitting: Podiatry

## 2023-06-11 ENCOUNTER — Encounter: Payer: Self-pay | Admitting: Podiatry

## 2023-06-11 VITALS — Ht 62.0 in | Wt 210.6 lb

## 2023-06-11 DIAGNOSIS — L84 Corns and callosities: Secondary | ICD-10-CM | POA: Diagnosis not present

## 2023-06-11 NOTE — Progress Notes (Signed)
 Subjective:  Patient ID: Carolyn Stone, female    DOB: 1956/01/23,  MRN: [id number]  Chief Complaint  Patient presents with   Toe Pain    Pt is here due to right toe pain to the 4th and 5th toe states she has a burning sensation to the area, and a skin tag that is growing in between the toes.    68 y.o. female presents with the above complaint.  Patient presents with right fourth and fifth heloma molle.  Patient states is painful to touch with burning sensation.  She wanted to get it evaluated she has not seen anyone as prior to seeing me for this pain scale 7 out of 10 dull achy in nature.  She is a diabetic.   Review of Systems: Negative except as noted in the HPI. Denies N/V/F/Ch.  Past Medical History:  Diagnosis Date   Allergy    Anxiety    Arthritis    Connective tissue disorder (HCC)    Dysphagia 12/25/2016   Hypertension    Hypothyroidism    Myalgia and myositis    undetermined inflammatory process    Current Outpatient Medications:    acetaminophen  (TYLENOL ) 650 MG CR tablet, Take 1,300 mg by mouth every 8 (eight) hours as needed for pain., Disp: , Rfl:    amLODipine (NORVASC) 10 MG tablet, Take 10 mg by mouth daily., Disp: , Rfl:    BIOTIN PO, Take 1 tablet by mouth every other day., Disp: , Rfl:    Cholecalciferol (VITAMIN D3) 125 MCG (5000 UT) TABS, Take 10,000 Units by mouth every other day., Disp: , Rfl:    clobetasol cream (TEMOVATE) 0.05 %, Apply 1 Application topically 2 (two) times daily as needed (irritation)., Disp: , Rfl:    diclofenac  sodium (VOLTAREN ) 1 % GEL, Apply 2 g topically 4 (four) times daily. Prn pain (Patient taking differently: Apply 2 g topically 4 (four) times daily as needed (pain).), Disp: 100 g, Rfl: 0   hydroxychloroquine (PLAQUENIL) 200 MG tablet, Take 400 mg by mouth daily., Disp: , Rfl:    levothyroxine  (SYNTHROID ) 100 MCG tablet, Take 1 tablet (100 mcg total) by mouth daily before breakfast., Disp: 90 tablet, Rfl: 3   Lifitegrast  (XIIDRA) 5 % SOLN, Place 1 drop into both eyes daily., Disp: , Rfl:    losartan (COZAAR) 100 MG tablet, Take 100 mg by mouth daily., Disp: , Rfl:    metoprolol tartrate (LOPRESSOR) 50 MG tablet, Take 50 mg by mouth 2 (two) times daily., Disp: , Rfl:    montelukast  (SINGULAIR ) 10 MG tablet, Take 1 tablet by mouth once daily, Disp: 90 tablet, Rfl: 0   Multiple Vitamin (MULTIVITAMIN ADULT PO), Take 1 tablet by mouth every other day., Disp: , Rfl:    pantoprazole  (PROTONIX ) 40 MG tablet, Take 40 mg by mouth daily., Disp: , Rfl:   Social History   Tobacco Use  Smoking Status Never  Smokeless Tobacco Never    Allergies  Allergen Reactions   Bee Venom Other (See Comments) and Swelling   Macrodantin [Nitrofurantoin Macrocrystal] Nausea And Vomiting and Nausea Only   Nitrofurantoin Other (See Comments)    REACTION: weakness, vomiting, fainting   Objective:  There were no vitals filed for this visit. Body mass index is 38.52 kg/m. Constitutional Well developed. Well nourished.  Vascular Dorsalis pedis pulses palpable bilaterally. Posterior tibial pulses palpable bilaterally. Capillary refill normal to all digits.  No cyanosis or clubbing noted. Pedal hair growth normal.  Neurologic Normal speech.  Oriented to person, place, and time. Epicritic sensation to light touch grossly present bilaterally.  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  Pain on palpation to the heloma molle  Orthopedic: Normal joint ROM without pain or crepitus bilaterally. No visible deformities. No bony tenderness.   Radiographs: None Assessment:   1. Heloma molle    Plan:  Patient was evaluated and treated and all questions answered.  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  No complication noted no pinpoint bleeding noted -Shoe gear modification was discussed -Toe protector was dispensed  No follow-ups on file.

## 2023-06-20 ENCOUNTER — Ambulatory Visit: Payer: Medicare Other | Admitting: Podiatry

## 2023-07-18 ENCOUNTER — Telehealth: Payer: Self-pay

## 2023-07-18 DIAGNOSIS — E039 Hypothyroidism, unspecified: Secondary | ICD-10-CM

## 2023-07-18 DIAGNOSIS — E0580 Other thyrotoxicosis without thyrotoxic crisis or storm: Secondary | ICD-10-CM

## 2023-07-18 NOTE — Telephone Encounter (Signed)
Spoke with pt advising her she can have her labs drawn early and if necessary we will move her appt up per Dr.Nida. Pt voiced understanding. Orders for labs updated and sent to [facility].

## 2023-07-18 NOTE — Telephone Encounter (Signed)
Pt called stating her gynecologist prescribed Yuvafem recently. Pt asked if her thyroid levels should be checked and med adjusted.

## 2023-08-29 ENCOUNTER — Telehealth: Payer: Self-pay | Admitting: "Endocrinology

## 2023-08-29 NOTE — Telephone Encounter (Signed)
 Labs were updated in February

## 2023-08-29 NOTE — Telephone Encounter (Signed)
 Labs need to be updated.

## 2023-08-29 NOTE — Telephone Encounter (Signed)
 Lab order should be okay,

## 2023-08-31 LAB — T4, FREE: Free T4: 1.12 ng/dL (ref 0.82–1.77)

## 2023-08-31 LAB — TSH: TSH: 3.95 u[IU]/mL (ref 0.450–4.500)

## 2023-09-03 ENCOUNTER — Encounter: Payer: Self-pay | Admitting: "Endocrinology

## 2023-09-03 ENCOUNTER — Ambulatory Visit: Admitting: "Endocrinology

## 2023-09-03 VITALS — BP 132/78 | HR 68 | Ht 62.0 in | Wt 195.8 lb

## 2023-09-03 DIAGNOSIS — E039 Hypothyroidism, unspecified: Secondary | ICD-10-CM

## 2023-09-03 MED ORDER — LEVOTHYROXINE SODIUM 100 MCG PO TABS: 100.0000 ug | ORAL_TABLET | Freq: Every day | ORAL | 3 refills | Status: AC

## 2023-09-03 NOTE — Progress Notes (Signed)
 09/03/2023, 5:36 PM   Endocrinology follow-up note  Subjective:    Patient ID: Carolyn Stone, female    DOB: 1955-10-17, PCP Stone, Carolyn Reil, FNP   Past Medical History:  Diagnosis Date   Allergy    Anxiety    Arthritis    Connective tissue disorder (HCC)    Dysphagia 12/25/2016   Hypertension    Hypothyroidism    Myalgia and myositis    "undetermined inflammatory process"   Past Surgical History:  Procedure Laterality Date   Bilateral foot surgery     CARPAL TUNNEL RELEASE Bilateral    CESAREAN SECTION     COLONOSCOPY WITH PROPOFOL N/A 04/11/2022   Procedure: COLONOSCOPY WITH PROPOFOL;  Surgeon: Dolores Frame, MD;  [facility];  Service: Gastroenterology;  Laterality: N/A;  1045 ASA 2   DILATION AND CURETTAGE OF UTERUS     x2   FOOT SURGERY Left    POLYPECTOMY  04/11/2022   Procedure: POLYPECTOMY;  Surgeon: Marguerita Merles, Reuel Boom, MD;  [facility];  Service: Gastroenterology;;   Social History   Socioeconomic History   Marital status: Married    Spouse name: Carolyn Stone   Number of children: 2   Years of education: 16   Highest education level: Not on file  Occupational History   Occupation: teacher-part-time    Comment: GED classes  Tobacco Use   Smoking status: Never   Smokeless tobacco: Never  Vaping Use   Vaping status: Never Used  Substance and Sexual Activity   Alcohol use: No   Drug use: No   Sexual activity: Yes    Partners: Male    Birth control/protection: Post-menopausal    Comment: husband s/p vasectomy  Other Topics Concern   Not on file  Social History Narrative   Lives with her husband of 36 years.  Their 2 daughters are grown and live independently.Retired Runner, broadcasting/film/video.   Social Drivers of Corporate investment banker Strain: Not on file  Food Insecurity: Not on file  Transportation Needs: Not on file  Physical Activity: Not on file  Stress: Not on  file  Social Connections: Not on file   Family History  Problem Relation Age of Onset   Cancer Mother        squamous cell-larygopharyngeal-died age 29   Cancer Father        lung/liver cancer- died age 33   Mental illness Father        ?schizophrenia   Mental illness Sister        Bipolar Disorder   Hypertension Brother    Cancer Maternal Grandmother    Hypertension Paternal Grandmother    Hyperlipidemia Paternal Grandmother    Stroke Paternal Grandfather    Colon cancer Maternal Uncle    Outpatient Encounter Medications as of 09/03/2023  Medication Sig   acetaminophen (TYLENOL) 650 MG CR tablet Take 1,300 mg by mouth every 8 (eight) hours as needed for pain.   amLODipine (NORVASC) 10 MG tablet Take 10 mg by mouth daily.   BIOTIN PO Take 1 tablet by mouth every other day.   Cholecalciferol (VITAMIN D3) 125 MCG (5000 UT) TABS Take 10,000 Units by mouth every other day.  clobetasol cream (TEMOVATE) 0.05 % Apply 1 Application topically 2 (two) times daily as needed (irritation).   diclofenac sodium (VOLTAREN) 1 % GEL Apply 2 g topically 4 (four) times daily. Prn pain (Patient taking differently: Apply 2 g topically 4 (four) times daily as needed (pain).)   hydroxychloroquine (PLAQUENIL) 200 MG tablet Take 400 mg by mouth daily.   levothyroxine (SYNTHROID) 100 MCG tablet Take 1 tablet (100 mcg total) by mouth daily before breakfast.   Lifitegrast (XIIDRA) 5 % SOLN Place 1 drop into both eyes daily.   losartan (COZAAR) 100 MG tablet Take 100 mg by mouth daily.   metoprolol tartrate (LOPRESSOR) 50 MG tablet Take 50 mg by mouth daily.   montelukast (SINGULAIR) 10 MG tablet Take 1 tablet by mouth once daily   Multiple Vitamin (MULTIVITAMIN ADULT PO) Take 1 tablet by mouth every other day.   pantoprazole (PROTONIX) 40 MG tablet Take 40 mg by mouth daily.   [DISCONTINUED] levothyroxine (SYNTHROID) 100 MCG tablet Take 1 tablet (100 mcg total) by mouth daily before breakfast.    [DISCONTINUED] progesterone (PROMETRIUM) 200 MG capsule Take 1 capsule by mouth once daily   No facility-administered encounter medications on file as of 09/03/2023.   ALLERGIES: Allergies  Allergen Reactions   Bee Venom Other (See Comments) and Swelling   Macrodantin [Nitrofurantoin Macrocrystal] Nausea And Vomiting and Nausea Only   Nitrofurantoin Other (See Comments)    REACTION: weakness, vomiting, fainting    VACCINATION STATUS: Immunization History  Administered Date(s) Administered   Influenza Whole 03/26/2008   Influenza,inj,Quad PF,6+ Mos 06/15/2015, 05/18/2019   Influenza,inj,quad, With Preservative 03/15/2017   Influenza-Unspecified 03/01/2020   Moderna Sars-Covid-2 Vaccination 07/27/2019, 08/27/2019, 04/11/2020   Tdap 11/20/2013    HPI Carolyn Stone is 68 y.o. female who presents today with a medical history as above. she is being seen in follow-up after she was seen in consultation for hypothyroidism  requested by Stone, Carolyn Reil, FNP.  History is obtained directly from the patient as well as chart review.  Reportedly, she was diagnosed with hypothyroidism at approximate age of 36 years.  She was started on NP thyroid at times up to 150 mg daily.   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  Her thyroid hormone dose on formulation was progressively switched to her current dose of 100 mcg of levothyroxine daily.  She continues to feel better.  Her previous symptoms of palpitation, anxiety and tremors have largely disappeared.  She has no new complaints today. Her previsit thyroid function tests are consistent with appropriate replacement. She remains on propranolol 50 mg p.o. once a day.  Her other medical problems include hypertension, IBS, myositis, arthralgias/arthritis, connective tissue disorders.  She denies dysphagia, shortness of breath, nor voice change. Review of Systems  Constitutional: + Gained approximately 10 pounds since  November 2023.     Objective:       09/03/2023   11:25 AM 06/11/2023    1:39 PM 11/01/2022   10:38 AM  Vitals with BMI  Height 5\' 2"  5\' 2"    Weight 195 lbs 13 oz 210 lbs 10 oz   BMI 35.8 38.51   Systolic 132  134  Diastolic 78  83  Pulse 68  81    BP 132/78   Pulse 68   Ht 5\' 2"  (1.575 m)   Wt 195 lb 12.8 oz (88.8 kg)   BMI 35.81 kg/m   Wt Readings from Last 3 Encounters:  09/03/23 195 lb  12.8 oz (88.8 kg)  06/11/23 210 lb 9.6 oz (95.5 kg)  09/19/22 210 lb 9.6 oz (95.5 kg)    Physical Exam  Constitutional:  Body mass index is 35.81 kg/m.,  not in acute distress, normal state of mind Eyes: PERRLA, EOMI, no exophthalmos ENT: moist mucous membranes, + palpable thyromegaly, no gross cervical lymphadenopathy Cardiovascular: normal precordial activity, Regular Rate and Rhythm, no Murmur/Rubs/Gallops   CMP ( most recent) CMP     Component Value Date/Time   NA 141 10/20/2021 0000   K 4.1 10/20/2021 0000   CL 107 10/20/2021 0000   CO2 25 (A) 10/20/2021 0000   GLUCOSE 94 10/27/2020 1526   BUN 13 10/20/2021 0000   CREATININE 0.7 10/20/2021 0000   CREATININE 0.84 10/27/2020 1526   CALCIUM 9.2 10/20/2021 0000   PROT 7.2 10/27/2020 1526   PROT 7.7 08/02/2016 1001   ALBUMIN 3.9 10/20/2021 0000   ALBUMIN 4.4 08/02/2016 1001   AST 18 10/20/2021 0000   ALT 30 (H) 10/27/2020 1526   ALKPHOS 30 10/20/2021 0000   BILITOT 0.6 10/27/2020 1526   BILITOT 0.8 08/02/2016 1001   GFRNONAA 73 10/27/2020 1526   GFRAA 85 10/27/2020 1526     Diabetic Labs (most recent): Lab Results  Component Value Date   HGBA1C 5.6 10/20/2021   HGBA1C 5.4 08/03/2020   HGBA1C 5.5 07/07/2019   MICROALBUR 0.9 08/03/2020   MICROALBUR 0.50 08/18/2008     Lipid Panel ( most recent) Lipid Panel     Component Value Date/Time   CHOL 150 10/20/2021 0000   CHOL 218 (H) 08/02/2016 1001   TRIG 150 10/20/2021 0000   HDL 39 10/20/2021 0000   HDL 43 08/02/2016 1001   CHOLHDL 4.0 08/03/2020 1531   VLDL  25 05/07/2014 1111   LDLCALC 86 10/20/2021 0000   LDLCALC 113 (H) 08/03/2020 1531   LABVLDL 33 08/02/2016 1001      Lab Results  Component Value Date   TSH 3.950 08/30/2023   TSH 2.700 09/13/2022   TSH 2.920 07/13/2022   TSH 0.38 (A) 10/20/2021   TSH 0.10 (L) 10/27/2020   TSH 0.43 08/03/2020   TSH 2.28 07/07/2019   FREET4 1.12 08/30/2023   FREET4 1.48 09/13/2022   FREET4 1.53 07/13/2022   FREET4 1.0 08/03/2020      Assessment & Plan:   1. Hypothyroidism, unspecified type  -She did have 2 sets of thyroid function test since last visit consistent with appropriate replacement.  She is doing much better on levothyroxine compared to when she was taking NP thyroid.  She is advised to continue levothyroxine 100 mcg p.o. daily before breakfast.   - We discussed about the correct intake of her thyroid hormone, on empty stomach at fasting, with water, separated by at least 30 minutes from breakfast and other medications,  and separated by more than 4 hours from calcium, iron, multivitamins, acid reflux medications (PPIs). -Patient is made aware of the fact that thyroid hormone replacement is needed for life, dose to be adjusted by periodic monitoring of thyroid function tests.   We will be considered for baseline thyroid ultrasound after her next visit.  Her pulse rate today is 68.  She reports that she is currently on metoprolol 50 mg p.o. once a day.     she is advised to maintain close follow up with Stone, Carolyn Reil, FNP for primary care needs.  I spent  22  minutes in the care of the patient today including review of  labs from Thyroid Function, CMP, and other relevant labs ; imaging/biopsy records (current and previous including abstractions from other facilities); face-to-face time discussing  her lab results and symptoms, medications doses, her options of short and long term treatment based on the latest standards of care / guidelines;   and documenting the encounter.  Carolyn Stone  participated in the discussions, expressed understanding, and voiced agreement with the above plans.  All questions were answered to her satisfaction. she is encouraged to contact clinic should she have any questions or concerns prior to her return visit.    Follow up plan: Return in about 1 year (around 09/02/2024) for Fasting Labs  in AM B4 8.   Marquis Lunch, MD [facility] [address] [place], [place] [address] Phone: [phone]  Fax: [phone]     09/03/2023, 5:36 PM  This note was partially dictated with voice recognition software. Similar sounding words can be transcribed inadequately or may not  be corrected upon review.

## 2023-09-19 ENCOUNTER — Ambulatory Visit: Payer: Medicare Other | Admitting: "Endocrinology

## 2023-10-07 ENCOUNTER — Ambulatory Visit: Payer: Medicare Other | Admitting: "Endocrinology

## 2023-11-05 ENCOUNTER — Ambulatory Visit: Attending: Internal Medicine | Admitting: Internal Medicine

## 2023-11-05 ENCOUNTER — Encounter: Payer: Self-pay | Admitting: Internal Medicine

## 2023-11-05 NOTE — Progress Notes (Signed)
 Erroneous encounter - please disregard.

## 2024-02-04 ENCOUNTER — Ambulatory Visit: Attending: Cardiology | Admitting: Cardiology

## 2024-02-04 ENCOUNTER — Encounter: Payer: Self-pay | Admitting: Cardiology

## 2024-02-04 VITALS — BP 126/82 | HR 72 | Ht 61.0 in | Wt 201.2 lb

## 2024-02-04 DIAGNOSIS — I10 Essential (primary) hypertension: Secondary | ICD-10-CM

## 2024-02-04 DIAGNOSIS — G4733 Obstructive sleep apnea (adult) (pediatric): Secondary | ICD-10-CM

## 2024-02-04 DIAGNOSIS — I4719 Other supraventricular tachycardia: Secondary | ICD-10-CM | POA: Diagnosis not present

## 2024-02-04 NOTE — Addendum Note (Signed)
 Addended by: Brevon Dewald G on: 02/04/2024 04:35 PM   Modules accepted: Orders

## 2024-02-04 NOTE — Patient Instructions (Signed)
 Medication Instructions:   Continue all current medications.   Labwork:  none  Testing/Procedures:  none  Follow-Up:  Your physician wants you to follow up in:  1 year.  You should receive a recall letter in the mail about 2 months prior to the time you are due.  If you don't receive this, please call our office to schedule your follow up appointment.      Any Other Special Instructions Will Be Listed Below (If Applicable).  You have been referred to:  Pulmonary   If you need a refill on your cardiac medications before your next appointment, please call your pharmacy.

## 2024-02-04 NOTE — Progress Notes (Signed)
 Clinical Summary Ms. Chihuahua is a 68 y.o.female seen today as a new patient for the following medical problems.    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  - compliant with toprol 50 mg,  no recent palpitations   2. HTN - compliant with meds  3. OSA - not using cpap, has had difficultly having a mask that was comfortable.  - needs to establish with sleep provider.    SH: Buyer, retail.  Past Medical History:  Diagnosis Date   Allergy    Anxiety    Arthritis    Connective tissue disorder (HCC)    Dysphagia 12/25/2016   Hypertension    Hypothyroidism    Myalgia and myositis    undetermined inflammatory process     Allergies  Allergen Reactions   Bee Venom Other (See Comments) and Swelling   Macrodantin [Nitrofurantoin Macrocrystal] Nausea And Vomiting and Nausea Only   Nitrofurantoin Other (See Comments)    REACTION: weakness, vomiting, fainting     Current Outpatient Medications  Medication Sig Dispense Refill   acetaminophen  (TYLENOL ) 650 MG CR tablet Take 1,300 mg by mouth every 8 (eight) hours as needed for pain.     amLODipine (NORVASC) 10 MG tablet Take 10 mg by mouth daily.     BIOTIN PO Take 1 tablet by mouth every other day.     Cholecalciferol (VITAMIN D3) 125 MCG (5000 UT) TABS Take 10,000 Units by mouth every other day.     clobetasol cream (TEMOVATE) 0.05 % Apply 1 Application topically 2 (two) times daily as needed (irritation).     diclofenac  sodium (VOLTAREN ) 1 % GEL Apply 2 g topically 4 (four) times daily. Prn pain (Patient taking differently: Apply 2 g topically 4 (four) times daily as needed (pain).) 100 g 0   hydroxychloroquine (PLAQUENIL) 200 MG tablet Take 400 mg by mouth daily.     levothyroxine  (SYNTHROID ) 100 MCG tablet Take 1 tablet (100 mcg total) by mouth daily before breakfast. 90 tablet 3    Lifitegrast (XIIDRA) 5 % SOLN Place 1 drop into both eyes daily.     losartan (COZAAR) 100 MG tablet Take 100 mg by mouth daily.     metoprolol tartrate (LOPRESSOR) 50 MG tablet Take 50 mg by mouth daily.     montelukast  (SINGULAIR ) 10 MG tablet Take 1 tablet by mouth once daily 90 tablet 0   Multiple Vitamin (MULTIVITAMIN ADULT PO) Take 1 tablet by mouth every other day.     pantoprazole  (PROTONIX ) 40 MG tablet Take 40 mg by mouth daily.     No current facility-administered medications for this visit.     Past Surgical History:  Procedure Laterality Date   Bilateral foot surgery     CARPAL TUNNEL RELEASE Bilateral    CESAREAN SECTION     COLONOSCOPY WITH PROPOFOL  N/A 04/11/2022   Procedure: COLONOSCOPY WITH PROPOFOL ;  Surgeon: Eartha Angelia Sieving, MD;  [facility];  Service: Gastroenterology;  Laterality: N/A;  1045 ASA 2   DILATION AND CURETTAGE OF UTERUS     x2   FOOT SURGERY Left    POLYPECTOMY  04/11/2022   Procedure: POLYPECTOMY;  Surgeon: Eartha Angelia Sieving, MD;  [facility];  Service: Gastroenterology;;     Allergies  Allergen Reactions   Bee Venom Other (See Comments) and Swelling   Macrodantin [Nitrofurantoin Macrocrystal] Nausea  And Vomiting and Nausea Only   Nitrofurantoin Other (See Comments)    REACTION: weakness, vomiting, fainting      Family History  Problem Relation Age of Onset   Cancer Mother        squamous cell-larygopharyngeal-died age 52   Cancer Father        lung/liver cancer- died age 83   Mental illness Father        ?schizophrenia   Mental illness Sister        Bipolar Disorder   Hypertension Brother    Cancer Maternal Grandmother    Hypertension Paternal Grandmother    Hyperlipidemia Paternal Grandmother    Stroke Paternal Grandfather    Colon cancer Maternal Uncle      Social History Ms. Difranco reports that she has never smoked. She has never used smokeless tobacco. Ms. Goodgame  reports no history of alcohol use.     Physical Examination Today's Vitals   02/04/24 1330  BP: 126/82  Pulse: 72  SpO2: 97%  Weight: 201 lb 3.2 oz (91.3 kg)  Height: 5' 1 (1.549 m)   Body mass index is 38.02 kg/m.  Gen: resting comfortably, no acute distress HEENT: no scleral icterus, pupils equal round and reactive, no palptable cervical adenopathy,  CV: RRR, no mrg, no jvd Resp: Clear to auscultation bilaterally GI: abdomen is soft, non-tender, non-distended, normal bowel sounds, no hepatosplenomegaly MSK: extremities are warm, no edema.  Skin: warm, no rash Neuro:  no focal deficits Psych: appropriate affect   Diagnostic Studies  TTE 10/19/2021:  Summary 1. The left ventricular systolic function is normal, LVEF is visually estimated at 55%. 2. The left ventricle is normal in size with normal wall thickness. 3. There is mild mitral valve regurgitation. 4. The left atrium is mildly dilated in size. 5. The right ventricle is normal in size, with normal systolic function. 6. There is mild pulmonary hypertension. 7. IVC size and inspiratory change suggest mildly elevated right atrial pressure. (5-10 mmHg).    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 HTN - at goal, continue current meds  3. OSA - needs to establish with sleep provider, refer to [place] pulmonary   F/u 1 year    Dorn PHEBE Ross, M.D.

## 2024-03-11 ENCOUNTER — Encounter (INDEPENDENT_AMBULATORY_CARE_PROVIDER_SITE_OTHER): Payer: Self-pay | Admitting: Gastroenterology

## 2024-03-11 ENCOUNTER — Telehealth: Payer: Self-pay

## 2024-03-11 NOTE — Telephone Encounter (Signed)
 Pt called stating she had labs drawn for her specialist at [facility]. Requested your opinion of her lab results if any would affect her thyroid . Labs were performed 03/09/24 and are in Care Everywhere.

## 2024-03-12 NOTE — Telephone Encounter (Signed)
 Spoke with pt, discussed the labs that were performed did not include her labs needed to monitor her thyroid  function and Dr. Lenis stated this was ok as long as she continues taking the same dose of her Levothyroxine  also that her Vitamin D  level showed she needs supplementing and recommended 2000 units daily per Dr. Lenis. Pt voiced understanding.

## 2024-03-24 NOTE — Progress Notes (Deleted)
 New Patient Pulmonology Office Visit   Subjective:  Patient ID: Carolyn Stone, female    DOB: 05-13-1956  MRN: [id number]  Referred by: Alvan Dorn FALCON, MD  CC: No chief complaint on file.   HPI Carolyn Stone is a 68 y.o. 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   The Epworth Sleepiness score is ***/24.   {STOPBANG:33649}   {PULM QUESTIONNAIRES (Optional):33196}  ROS  Allergies: Bee venom, Macrodantin [nitrofurantoin macrocrystal], and Nitrofurantoin  Current Outpatient Medications:    acetaminophen  (TYLENOL ) 650 MG CR tablet, Take 1,300 mg by mouth every 8 (eight) hours as needed for pain., Disp: , Rfl:    amLODipine (NORVASC) 5 MG tablet, Take 5 mg by mouth daily., Disp: , Rfl:    BIOTIN PO, Take 1 tablet by mouth every other day., Disp: , Rfl:    Cholecalciferol (VITAMIN D3) 125 MCG (5000 UT) TABS, Take 10,000 Units by mouth every other day., Disp: , Rfl:    clobetasol cream (TEMOVATE) 0.05 %, Apply 1 Application topically 2 (two) times daily as needed (irritation)., Disp: , Rfl:    diclofenac  sodium (VOLTAREN ) 1 % GEL, Apply 2 g topically 4 (four) times daily. Prn pain (Patient taking differently: Apply 2 g topically 4 (four) times daily as needed (pain).), Disp: 100 g, Rfl: 0   hydroxychloroquine (PLAQUENIL) 200 MG tablet, Take 400 mg by mouth daily., Disp: , Rfl:    levothyroxine  (SYNTHROID ) 100 MCG tablet, Take 1 tablet (100 mcg total) by mouth daily before breakfast., Disp: 90 tablet, Rfl: 3   Lifitegrast (XIIDRA) 5 % SOLN, Place 1 drop into both eyes daily., Disp: , Rfl:    losartan (COZAAR) 100 MG tablet, Take 100 mg by mouth daily., Disp: , Rfl:    metoprolol succinate (TOPROL-XL) 50 MG 24 hr tablet, Take 50 mg by mouth daily. Take with or immediately following a meal., Disp: , Rfl:     montelukast  (SINGULAIR ) 10 MG tablet, Take 1 tablet by mouth once daily, Disp: 90 tablet, Rfl: 0   pantoprazole  (PROTONIX ) 40 MG tablet, Take 40 mg by mouth daily., Disp: , Rfl:    prednisoLONE acetate (PRED FORTE) 1 % ophthalmic suspension, Place 1 drop into both eyes daily., Disp: , Rfl:  Past Medical History:  Diagnosis Date   Allergy    Anxiety    Arthritis    Connective tissue disorder    Dysphagia 12/25/2016   Hypertension    Hypothyroidism    Myalgia and myositis    undetermined inflammatory process   Past Surgical History:  Procedure Laterality Date   Bilateral foot surgery     CARPAL TUNNEL RELEASE Bilateral    CESAREAN SECTION     COLONOSCOPY WITH PROPOFOL  N/A 04/11/2022   Procedure: COLONOSCOPY WITH PROPOFOL ;  Surgeon: Eartha Angelia Sieving, MD;  [facility];  Service: Gastroenterology;  Laterality: N/A;  1045 ASA 2   DILATION AND CURETTAGE OF UTERUS     x2   FOOT SURGERY Left    POLYPECTOMY  04/11/2022   Procedure: POLYPECTOMY;  Surgeon: Eartha Angelia Sieving, MD;  [facility];  Service: Gastroenterology;;   Family History  Problem Relation Age of Onset   Cancer Mother        squamous cell-larygopharyngeal-died age 37   Cancer Father        lung/liver cancer- died age 37   Mental  illness Father        ?schizophrenia   Mental illness Sister        Bipolar Disorder   Hypertension Brother    Cancer Maternal Grandmother    Hypertension Paternal Grandmother    Hyperlipidemia Paternal Grandmother    Stroke Paternal Grandfather    Colon cancer Maternal Uncle    Social History   Socioeconomic History   Marital status: Married    Spouse name: Vaughn   Number of children: 2   Years of education: 16   Highest education level: Not on file  Occupational History   Occupation: teacher-part-time    Comment: GED classes  Tobacco Use   Smoking status: Never   Smokeless tobacco: Never  Vaping Use   Vaping status: Never Used   Substance and Sexual Activity   Alcohol use: No   Drug use: No   Sexual activity: Yes    Partners: Male    Birth control/protection: Post-menopausal    Comment: husband s/p vasectomy  Other Topics Concern   Not on file  Social History Narrative   Lives with her husband of 36 years.  Their 2 daughters are grown and live independently.Retired runner, broadcasting/film/video.   Social Drivers of Corporate Investment Banker Strain: Not on file  Food Insecurity: Not on file  Transportation Needs: Not on file  Physical Activity: Not on file  Stress: Not on file  Social Connections: Not on file  Intimate Partner Violence: Not on file       Objective:  There were no vitals taken for this visit. {Pulm Vitals (Optional):32837}  Physical Exam  Diagnostic Review:  {Labs (Optional):32838}     Assessment & Plan:   Assessment & Plan   No orders of the defined types were placed in this encounter.     No follow-ups on file.   Aly Hauser, MD

## 2024-03-25 ENCOUNTER — Ambulatory Visit: Admitting: Pulmonary Disease

## 2024-04-01 ENCOUNTER — Encounter (HOSPITAL_BASED_OUTPATIENT_CLINIC_OR_DEPARTMENT_OTHER): Payer: Self-pay

## 2024-04-01 ENCOUNTER — Ambulatory Visit (INDEPENDENT_AMBULATORY_CARE_PROVIDER_SITE_OTHER)

## 2024-04-01 VITALS — BP 147/71 | HR 63 | Ht 61.0 in | Wt 199.0 lb

## 2024-04-01 DIAGNOSIS — I4891 Unspecified atrial fibrillation: Secondary | ICD-10-CM

## 2024-04-01 DIAGNOSIS — I10 Essential (primary) hypertension: Secondary | ICD-10-CM | POA: Diagnosis not present

## 2024-04-01 DIAGNOSIS — G4733 Obstructive sleep apnea (adult) (pediatric): Secondary | ICD-10-CM | POA: Diagnosis not present

## 2024-04-01 NOTE — Progress Notes (Signed)
 @Patient  ID: Carolyn Stone Level, female    DOB: 1956-03-15, 68 y.o.   MRN: [id number]  Chief Complaint  Patient presents with   Establish Care    New sleep     Referring provider: Bucio, Elsa C, FNP  HPI: Discussed the use of AI scribe software for clinical note transcription with the patient, who gave verbal consent to proceed.  History of Present Illness Carolyn Stone is a 68 year old female with sleep apnea who presents with difficulty using CPAP therapy.  She was diagnosed with sleep apnea via a home sleep study approximately two years ago. She was provided with a CPAP machine but has struggled to find a mask that fits comfortably, leading to discontinuation of its use. She still possesses the machine, which was obtained through a pharmacy, but has not been able to use it effectively due to mask fitting issues. Previous attempts to use the CPAP were unsuccessful as the mask would disturb her sleep, particularly because she sleeps on her side.  She experiences difficulty falling asleep, waking up multiple times during the night, and clenching her teeth. Her husband has noted that she snores and 'gasps more than I snore' during sleep. She sometimes wakes up gasping for breath, particularly before falling fully asleep. In the mornings, she often feels unrested and sometimes falls back asleep in a chair after waking up. She typically goes to bed between 1 and 3 AM and wakes up around 9 AM, but her sleep is frequently interrupted.  She has a history of high blood pressure, currently managed with losartan once daily, after discontinuing amlodipine. She has experienced fluctuations in her blood pressure and has had episodes of atrial fibrillation, with two known episodes triggered by physical exertion.  She experiences neuropathy in her feet, which worsens when she lies down to sleep, causing sharp and burning pain that disrupts her rest. She has had multiple foot surgeries in the past.  Her sleep issues have been exacerbated by stress and difficulty 'shutting my brain down' at night. She also reports tinnitus, which sometimes disturbs her sleep.     TEST/EVENTS :   Allergies  Allergen Reactions   Bee Venom Other (See Comments) and Swelling   Macrodantin [Nitrofurantoin Macrocrystal] Nausea And Vomiting and Nausea Only   Nitrofurantoin Other (See Comments)    REACTION: weakness, vomiting, fainting    Immunization History  Administered Date(s) Administered   Influenza Whole 03/26/2008   Influenza,inj,Quad PF,6+ Mos 06/15/2015, 05/18/2019   Influenza,inj,quad, With Preservative 03/15/2017   Influenza-Unspecified 03/01/2020   Moderna Sars-Covid-2 Vaccination 07/27/2019, 08/27/2019, 04/11/2020   Tdap 11/20/2013    Past Medical History:  Diagnosis Date   Allergy    Anxiety    Arthritis    Connective tissue disorder    Dysphagia 12/25/2016   Hypertension    Hypothyroidism    Myalgia and myositis    undetermined inflammatory process    Tobacco History: Social History   Tobacco Use  Smoking Status Never  Smokeless Tobacco Never   Counseling given: Not Answered   Outpatient Medications Prior to Visit  Medication Sig Dispense Refill   acetaminophen  (TYLENOL ) 650 MG CR tablet Take 1,300 mg by mouth every 8 (eight) hours as needed for pain.     amLODipine (NORVASC) 5 MG tablet Take 5 mg by mouth daily.     BIOTIN PO Take 1 tablet by mouth every other day.     Cholecalciferol (VITAMIN D3) 125 MCG (5000 UT) TABS Take 10,000 Units  by mouth every other day.     clobetasol cream (TEMOVATE) 0.05 % Apply 1 Application topically 2 (two) times daily as needed (irritation).     diclofenac  sodium (VOLTAREN ) 1 % GEL Apply 2 g topically 4 (four) times daily. Prn pain (Patient taking differently: Apply 2 g topically 4 (four) times daily as needed (pain).) 100 g 0   hydroxychloroquine (PLAQUENIL) 200 MG tablet Take 400 mg by mouth daily.     levothyroxine  (SYNTHROID )  100 MCG tablet Take 1 tablet (100 mcg total) by mouth daily before breakfast. 90 tablet 3   Lifitegrast (XIIDRA) 5 % SOLN Place 1 drop into both eyes daily.     losartan (COZAAR) 100 MG tablet Take 100 mg by mouth daily.     metoprolol succinate (TOPROL-XL) 50 MG 24 hr tablet Take 50 mg by mouth daily. Take with or immediately following a meal.     montelukast  (SINGULAIR ) 10 MG tablet Take 1 tablet by mouth once daily 90 tablet 0   pantoprazole  (PROTONIX ) 40 MG tablet Take 40 mg by mouth daily.     prednisoLONE acetate (PRED FORTE) 1 % ophthalmic suspension Place 1 drop into both eyes daily.     No facility-administered medications prior to visit.     Review of Systems: as per HPI  Constitutional:   No  weight loss, night sweats,  Fevers, chills, fatigue, or  lassitude.  HEENT:   No headaches,  Difficulty swallowing,  Tooth/dental problems, or  Sore throat,                No sneezing, itching, ear ache, nasal congestion, post nasal drip,   CV:  No chest pain,  Orthopnea, PND, swelling in lower extremities, anasarca, dizziness, palpitations, syncope.   GI  No heartburn, indigestion, abdominal pain, nausea, vomiting, diarrhea, change in bowel habits, loss of appetite, bloody stools.   Resp: No shortness of breath with exertion or at rest.  No excess mucus, no productive cough,  No non-productive cough,  No coughing up of blood.  No change in color of mucus.  No wheezing.  No chest wall deformity  Skin: no rash or lesions.  GU: no dysuria, change in color of urine, no urgency or frequency.  No flank pain, no hematuria   MS:  No joint pain or swelling.  No decreased range of motion.  No back pain.    Physical Exam  BP (!) 147/71   Pulse 63   Ht 5' 1 (1.549 m)   Wt 199 lb (90.3 kg)   SpO2 99%   BMI 37.60 kg/m   GEN: A/Ox3; pleasant , NAD, well nourished    HEENT:  [place]/AT,  EACs-clear, TMs-wnl, NOSE-clear, THROAT-clear, no lesions, no postnasal drip or exudate noted. Mallampati  3  NECK:  Supple w/ fair ROM; no JVD; normal carotid impulses w/o bruits; no thyromegaly or nodules palpated; no lymphadenopathy.    RESP  Clear  P & A; w/o, wheezes/ rales/ or rhonchi. no accessory muscle use, no dullness to percussion  CARD:  RRR, no m/r/g, no peripheral edema, pulses intact, no cyanosis or clubbing.  GI:   Soft & nt; nml bowel sounds; no organomegaly or masses detected.   Musco: Warm bil, no deformities or joint swelling noted.   Neuro: alert, no focal deficits noted.    Skin: Warm, no lesions or rashes    Lab Results:  CBC    Component Value Date/Time   WBC 7.0 08/03/2020 1531   RBC  4.58 08/03/2020 1531   HGB 14.2 08/03/2020 1531   HCT 41.8 08/03/2020 1531   PLT 284 08/03/2020 1531   MCV 91.3 08/03/2020 1531   MCH 31.0 08/03/2020 1531   MCHC 34.0 08/03/2020 1531   RDW 11.9 08/03/2020 1531   LYMPHSABS 2.2 05/07/2014 1111   MONOABS 0.4 05/07/2014 1111   EOSABS 0.3 05/07/2014 1111   BASOSABS 0.0 05/07/2014 1111    BMET    Component Value Date/Time   NA 141 10/20/2021 0000   K 4.1 10/20/2021 0000   CL 107 10/20/2021 0000   CO2 25 (A) 10/20/2021 0000   GLUCOSE 94 10/27/2020 1526   BUN 13 10/20/2021 0000   CREATININE 0.7 10/20/2021 0000   CREATININE 0.84 10/27/2020 1526   CALCIUM 9.2 10/20/2021 0000   GFRNONAA 73 10/27/2020 1526   GFRAA 85 10/27/2020 1526    BNP No results found for: BNP  ProBNP No results found for: PROBNP  Imaging: No results found.  Administration History     None           No data to display          No results found for: NITRICOXIDE   Assessment & Plan:   Assessment & Plan OSA (obstructive sleep apnea) Assessment and Plan Assessment & Plan Obstructive sleep apnea with insomnia Non-compliance with CPAP due to mask issues. Persistent symptoms necessitate reassessment. - Order repeat home sleep study. - Consider new CPAP machine based on results. - Arrange mask fitting and  desensitization.  Hypertension Managed with losartan. Amlodipine discontinued due to hypotension. Blood pressure variable with occasional spikes.  Atrial fibrillation Two episodes linked to exertion.    Return in about 2 months (around 06/01/2024) for sleep study review.  Candis Dandy, PA-Stone 04/01/2024

## 2024-04-01 NOTE — Patient Instructions (Addendum)
 Complete home sleep test as ordered.  Follow up in 8-10 weeks to review results.  Continue sleep hygiene; see attached info packet.

## 2024-06-01 ENCOUNTER — Telehealth (HOSPITAL_BASED_OUTPATIENT_CLINIC_OR_DEPARTMENT_OTHER): Payer: Self-pay

## 2024-06-01 ENCOUNTER — Ambulatory Visit (HOSPITAL_BASED_OUTPATIENT_CLINIC_OR_DEPARTMENT_OTHER)

## 2024-06-01 NOTE — Telephone Encounter (Signed)
 Spoke with Carolyn Stone and pt should be using new mask and machine for 30 days before we do follow-up Pt notified and appt for 1/13 cancelled. She will call back to R/S once she resumes machine use   Copied from CRM [id number]. Topic: Appointments - Appointment Info/Confirmation >> Jun 01, 2024 11:00 AM Lavanda D wrote: Patient/patient representative is calling for information regarding an appointment. Patient would like to follow-up on if her appt for 1/13 is still needed, she said she is still waiting on a new mask so she hasn't used the CPAP much yet. She said she is supposed to be getting it this week.

## 2024-06-09 ENCOUNTER — Ambulatory Visit (HOSPITAL_BASED_OUTPATIENT_CLINIC_OR_DEPARTMENT_OTHER)

## 2024-09-02 ENCOUNTER — Ambulatory Visit: Admitting: "Endocrinology
